# Patient Record
Sex: Female | Born: 1935 | Race: White | Hispanic: No | Marital: Married | Smoking: Never smoker
Health system: Southern US, Community
[De-identification: ages and names within clinical notes are randomized; demographics above are authoritative.]

## PROBLEM LIST (undated history)

## (undated) DIAGNOSIS — E785 Hyperlipidemia, unspecified: Secondary | ICD-10-CM

## (undated) DIAGNOSIS — F0390 Unspecified dementia without behavioral disturbance: Secondary | ICD-10-CM

## (undated) DIAGNOSIS — M8580 Other specified disorders of bone density and structure, unspecified site: Secondary | ICD-10-CM

---

## 1997-12-26 ENCOUNTER — Other Ambulatory Visit: Admission: RE | Admit: 1997-12-26 | Discharge: 1997-12-26 | Payer: Self-pay | Admitting: Obstetrics & Gynecology

## 1999-01-23 ENCOUNTER — Other Ambulatory Visit: Admission: RE | Admit: 1999-01-23 | Discharge: 1999-01-23 | Payer: Self-pay | Admitting: Obstetrics & Gynecology

## 1999-10-30 ENCOUNTER — Encounter: Payer: Self-pay | Admitting: Neurology

## 1999-10-30 ENCOUNTER — Ambulatory Visit (HOSPITAL_COMMUNITY): Admission: RE | Admit: 1999-10-30 | Discharge: 1999-10-30 | Payer: Self-pay | Admitting: Neurology

## 2000-02-04 ENCOUNTER — Other Ambulatory Visit: Admission: RE | Admit: 2000-02-04 | Discharge: 2000-02-04 | Payer: Self-pay | Admitting: Obstetrics & Gynecology

## 2001-03-18 ENCOUNTER — Encounter: Admission: RE | Admit: 2001-03-18 | Discharge: 2001-03-18 | Payer: Self-pay | Admitting: Orthopedic Surgery

## 2001-03-18 ENCOUNTER — Encounter: Payer: Self-pay | Admitting: Orthopedic Surgery

## 2001-03-20 ENCOUNTER — Ambulatory Visit (HOSPITAL_BASED_OUTPATIENT_CLINIC_OR_DEPARTMENT_OTHER): Admission: RE | Admit: 2001-03-20 | Discharge: 2001-03-20 | Payer: Self-pay | Admitting: Orthopedic Surgery

## 2001-08-18 ENCOUNTER — Encounter: Admission: RE | Admit: 2001-08-18 | Discharge: 2001-08-18 | Payer: Self-pay | Admitting: Pulmonary Disease

## 2001-08-18 ENCOUNTER — Emergency Department (HOSPITAL_COMMUNITY): Admission: EM | Admit: 2001-08-18 | Discharge: 2001-08-18 | Payer: Self-pay | Admitting: Emergency Medicine

## 2001-08-18 ENCOUNTER — Encounter: Payer: Self-pay | Admitting: Pulmonary Disease

## 2001-08-18 ENCOUNTER — Encounter: Payer: Self-pay | Admitting: Emergency Medicine

## 2002-06-16 ENCOUNTER — Other Ambulatory Visit: Admission: RE | Admit: 2002-06-16 | Discharge: 2002-06-16 | Payer: Self-pay | Admitting: Obstetrics & Gynecology

## 2002-08-25 ENCOUNTER — Emergency Department (HOSPITAL_COMMUNITY): Admission: EM | Admit: 2002-08-25 | Discharge: 2002-08-25 | Payer: Self-pay | Admitting: *Deleted

## 2002-08-25 ENCOUNTER — Encounter: Payer: Self-pay | Admitting: *Deleted

## 2004-06-04 ENCOUNTER — Ambulatory Visit: Payer: Self-pay | Admitting: Pulmonary Disease

## 2004-07-16 ENCOUNTER — Emergency Department (HOSPITAL_COMMUNITY): Admission: EM | Admit: 2004-07-16 | Discharge: 2004-07-16 | Payer: Self-pay | Admitting: Emergency Medicine

## 2004-07-17 ENCOUNTER — Ambulatory Visit: Payer: Self-pay | Admitting: Pulmonary Disease

## 2004-10-01 ENCOUNTER — Ambulatory Visit: Payer: Self-pay | Admitting: Pulmonary Disease

## 2004-10-31 ENCOUNTER — Other Ambulatory Visit: Admission: RE | Admit: 2004-10-31 | Discharge: 2004-10-31 | Payer: Self-pay | Admitting: Obstetrics & Gynecology

## 2005-02-08 ENCOUNTER — Ambulatory Visit: Payer: Self-pay | Admitting: Pulmonary Disease

## 2005-06-11 ENCOUNTER — Ambulatory Visit: Payer: Self-pay | Admitting: Pulmonary Disease

## 2005-12-10 ENCOUNTER — Ambulatory Visit: Payer: Self-pay | Admitting: Pulmonary Disease

## 2006-01-03 ENCOUNTER — Emergency Department (HOSPITAL_COMMUNITY): Admission: EM | Admit: 2006-01-03 | Discharge: 2006-01-03 | Payer: Self-pay | Admitting: Emergency Medicine

## 2006-05-02 ENCOUNTER — Ambulatory Visit: Payer: Self-pay | Admitting: Gastroenterology

## 2006-05-26 ENCOUNTER — Ambulatory Visit (HOSPITAL_COMMUNITY): Admission: RE | Admit: 2006-05-26 | Discharge: 2006-05-26 | Payer: Self-pay | Admitting: Gastroenterology

## 2006-05-26 ENCOUNTER — Ambulatory Visit: Payer: Self-pay | Admitting: Gastroenterology

## 2006-05-27 ENCOUNTER — Ambulatory Visit (HOSPITAL_COMMUNITY): Admission: RE | Admit: 2006-05-27 | Discharge: 2006-05-27 | Payer: Self-pay | Admitting: Gastroenterology

## 2006-07-03 ENCOUNTER — Ambulatory Visit: Payer: Self-pay | Admitting: Gastroenterology

## 2006-07-17 ENCOUNTER — Ambulatory Visit: Payer: Self-pay | Admitting: Gastroenterology

## 2006-07-28 ENCOUNTER — Ambulatory Visit (HOSPITAL_COMMUNITY): Admission: RE | Admit: 2006-07-28 | Discharge: 2006-07-28 | Payer: Self-pay | Admitting: Gastroenterology

## 2006-07-31 ENCOUNTER — Ambulatory Visit (HOSPITAL_COMMUNITY): Admission: RE | Admit: 2006-07-31 | Discharge: 2006-07-31 | Payer: Self-pay | Admitting: Gastroenterology

## 2006-07-31 ENCOUNTER — Encounter (INDEPENDENT_AMBULATORY_CARE_PROVIDER_SITE_OTHER): Payer: Self-pay | Admitting: Specialist

## 2006-07-31 ENCOUNTER — Ambulatory Visit: Payer: Self-pay | Admitting: Gastroenterology

## 2006-08-13 ENCOUNTER — Ambulatory Visit: Payer: Self-pay | Admitting: Gastroenterology

## 2006-12-18 ENCOUNTER — Ambulatory Visit (HOSPITAL_COMMUNITY): Admission: RE | Admit: 2006-12-18 | Discharge: 2006-12-18 | Payer: Self-pay | Admitting: Internal Medicine

## 2007-03-03 ENCOUNTER — Emergency Department (HOSPITAL_COMMUNITY): Admission: EM | Admit: 2007-03-03 | Discharge: 2007-03-04 | Payer: Self-pay | Admitting: Emergency Medicine

## 2007-03-11 ENCOUNTER — Ambulatory Visit (HOSPITAL_COMMUNITY): Admission: RE | Admit: 2007-03-11 | Discharge: 2007-03-11 | Payer: Self-pay | Admitting: Ophthalmology

## 2010-12-11 NOTE — Op Note (Signed)
NAMELARAYAH, CLUTE             ACCOUNT NO.:  [id number]   MEDICAL RECORD NO.:  [id number]          PATIENT TYPE:  AMB   LOCATION:  DAY                           FACILITY:  [facility]   PHYSICIAN:  Susanne Greenhouse, MD       DATE OF BIRTH:  [birth year]   DATE OF PROCEDURE:  03/11/2007  DATE OF DISCHARGE:  03/11/2007                               OPERATIVE REPORT   PREOPERATIVE DIAGNOSIS:  Subluxed posterior chamber intraocular lens,  right eye.   POSTOPERATIVE DIAGNOSIS:  Subluxed posterior chamber intraocular lens,  right eye.   OPERATION PERFORMED:  Relocation of poster chamber interocular lens with  iris fixation, right eye.   SURGEON:  Susanne Greenhouse, M.D.   ANESTHESIA:  Was topical with monitored anesthesia care.   OPERATIVE SUMMARY:  In the preoperative holding area, dilating drops and  2% viscous lidocaine were placed into the right eye.  The patient was  then brought to the operating room on the eye was prepped and draped.  A  super-sharp blade was used to make two paracenteses, one at the  surgeon's 4 o'clock position and the 7 o'clock position.  A 2.85-mm  keratome incision was made at the temporal limbus.  The anterior chamber  was filled with Viscoat to push the vitreous back and to protect the  cornea.  Using a Sinskey hook and Kuglen hook, the intraocular lens was  engaged and pulled into the center of the visual axis in the ciliary  sulcus.  Once the lens was in the sulcus, it was prolapsed forward, and  Miochol was used to constrict the pupil, creating an iris capture of the  lens optic with the haptics still behind the iris.  Using a 10-0  polypropylene suture on a long needle, a suture was placed through the  iris underneath the haptic and through the iris again and then out  through the cornea.  The Siepser knot was tied to fixate the IOL to the  iris.  The ends were then cut.  The IOL was not as central as ideal, so  a second iris fixation suture was placed on the  other lens haptic.  After both haptics were fixated, the optic was then prolapsed back into  the posterior chamber through the pupil.  Good centration was achieved  with a second suture.  With irrigation and aspiration, Viscoat was then  removed from the anterior chamber.  Some Viscoat was left in the eye in  the posterior chamber and vitreous chamber.  The incisions were hydrated  with BSS and checked for leak which were negative.  The patient  tolerated the procedure well.  There were no operative complications,  and she was returned to the recovery area in satisfactory condition.           ______________________________  Susanne Greenhouse, MD     KEH/MEDQ  D:  04/10/2007  T:  04/11/2007  Job:  [id number]

## 2010-12-14 NOTE — Consult Note (Signed)
NAMEMICHELLE, WNEK             ACCOUNT NO.:  [id number]   MEDICAL RECORD NO.:  [id number]          PATIENT TYPE:  AMB   LOCATION:                                FACILITY:  [facility]   PHYSICIAN:  Kassie Mends, M.D.      DATE OF BIRTH:  August 28, 1922   DATE OF CONSULTATION:  05/02/2006  DATE OF DISCHARGE:                                   CONSULTATION   REASON FOR CONSULTATION:  Abdominal pain and bloating.   HISTORY OF PRESENT ILLNESS:  Gloria Mathis is an 77 year old female who has  been complaining of her stomach swelling and being puffed up and hard  and  not comfortable for 2 months.  Over the last 2 weeks, it has been more  noticeable.  She has had pain in there on occasion.  It is out of the  ordinary.  She denies any weight loss.  She usually has one bowel movement a  day.  This past week, it has been less than 1 day.  She denies any blood in  her stool or black tarry stools.  She has had no nausea, vomiting, or  difficulty swallowing.  Her appetite has been poor at times.  Her energy  level is okay.  She had been passing gas.  She has never had any abnormal  Paps or cyst on her ovaries.  Her mammogram is up-to-date.  She is not as  regular in the last week.  She burps some to a little.  She has never had  any workup.  She does not consume much milk.  She has had no dietary  changes.  If she does consume a milk, it is lactate.  She denies any cheese  or ice cream.  She drinks less than one carbonated beverage a day.  She  denies any sugar-free gum or candy consumption.  Her last bowel movement was  this morning which was hard and she did not have to strain.  He saw Dr.  Wende Crease who told her she needed some Milk of Magnesia.  She denies any chest  pain, shortness of breath, cough, back pain, or hemoptysis.  She has never  had a colonoscopy.   PAST MEDICAL HISTORY:  1. Hyperlipidemia treated with diet.  2. Osteopenia.   PAST SURGICAL HISTORY:  Appendectomy in the 1940s.   ALLERGIES:  PENICILLIN.   MEDICATIONS:  1. Multivitamin daily.  2. Caltrate D 600 mg two twice daily for the last 4-5 months.   FAMILY HISTORY:  She has a family history of her parents dying in a car  accident.  She denies any family history of colon cancer or colon polyps.   SOCIAL HISTORY:  She is married and has three children.  She is a retired  [employer] operational officer.  She denies any tobacco or alcohol use.   REVIEW OF SYSTEMS:  As per the HPI, otherwise all systems are negative.   PHYSICAL EXAMINATION:  VITAL SIGNS:  Weight 102.5 pounds.  Height 5 feet 6  inches.  BMI 16.5 (underweight).  Temperature 99.2. Blood pressure 124/68.  Pulse 72. GENERAL:  In  general, she is in no apparent distress, alert and  oriented x4.  HEENT EXAM:  Atraumatic.  Normocephalic.  Pupils equal, react  to light.  Mouth:  No oral lesions.  Posterior pharynx without erythema or  exudate.  NECK:  Her neck has full range of motion and no lymphadenopathy.  LUNGS:  Her lungs are clear to auscultation bilaterally.  CARDIOVASCULAR:  Exam is a regular rhythm; no murmur; normal S1 and S2.  ABDOMEN:  Bowel sounds are present; nontender; mild distention; tympanic to  percussion; no hepatosplenomegaly; no rebound or guarding.  EXTREMITIES:  Her extremities are without cyanosis, clubbing, or edema.  NEURO: She has no focal neurologic deficits.   She did bring plain films, KUB, flat and upright today which I personally  reviewed with her and her husband.  She has a nonspecific bowel gas pattern.  She has a significant stool burden, but no dilated loops of bowel, and no  air fluid levels.   ASSESSMENT:  Gloria Mathis is an 77 year old female female with mild abdominal  discomfort and bloating likely secondary to constipation.  She may be  experiencing constipation from her Caltrate and vitamin D.  Because she is  an 77 year old female who is underweight, she likely needs her calcium  supplementation.  The differential diagnosis  includes thyroid abnormalities  and electrolyte abnormalities and a low likelihood of occult malignancy.  Thank you for allowing me to see Gloria Mathis in consultation.  My  recommendations follow.   RECOMMENDATIONS:  1. I will check a CBC, potassium, thyroid, and calcium today.  2. For her constipation, she is asked to take MiraLax in the morning and      Fibersure at noon.  I would like to keep the stools soft and not runny      or watery.  3. She needs a screening colonoscopy because she is a highly functioning      77 year old female.  She will be given the OsmoPrep and the test will      be scheduled within the next 2-3 weeks.  If her symptoms do not      significantly improve  within the next 7-10 days, then I will consider      a CT scan of the abdomen and pelvis or discontinuing the Caltrate and      see how she does.  She may need an alternative to managing her      osteopenia.  4. She has a follow up appointment to see me in 3 weeks.   Please feel free to contact me at [phone] with additional questions.      Kassie Mends, M.D.  Electronically Signed     SM/MEDQ  D:  05/02/2006  T:  05/04/2006  Job:  [id number]   cc:   Laverle Hobby, M.D.  [address]  [place], [place] [address]

## 2010-12-14 NOTE — Op Note (Signed)
[facility]  Patient:    Gloria Mathis, Gloria Mathis Visit Number: [id number] MRN: [id number]          Service Type: DSU Location: [facility] Attending Physician:  Susa Day Proc. Date: 03/20/01 Adm. Date:  03/20/2001 Disc. Date: 03/20/2001                             Operative Report  PREOPERATIVE DIAGNOSIS:  Chronic stenosing tenosynovitis, left first dorsal compartment.  POSTOPERATIVE DIAGNOSIS:  Chronic stenosing tenosynovitis, left first dorsal compartment.  PROCEDURE:  Release of left first dorsal compartment.  SURGEON:  Katy Fitch. Sypher, Montez Hageman., M.D.  ASSISTANT:  Jonni Sanger, P.A.  ANESTHESIA:  0.25% Marcaine and 2% lidocaine field block of first dorsal compartment and radial sensory branches, left wrist, supervised by the anesthesiologist, Burna Forts, M.D.  INDICATIONS:  Gloria Mathis is a 77 year old woman who has had chronic stenosing tenosynovitis of the left dorsal compartment for months.  This has not responded to splinting, activity modification, and anti-inflammatory medication.  Due to a failure to respond to nonoperative measures, she is brought to the operating room at this time for release of her left first dorsal compartment.  DESCRIPTION OF PROCEDURE:  Gloria Mathis was brought to the operating room and placed in the supine position on the operating room table.  Following light sedation, the left arm was prepped with Betadine solution and sterilely draped.  A pneumatic tourniquet was applied to the proximal brachium.  Following exsanguination of the limb with an Esmarch bandage, the tourniquet was placed to 220 mmHg.  Marcaine 0.25% and 2% lidocaine were infiltrated at the radial styloid and in the path of the radial sensory branches for a field block anesthesia.  When anesthesia was satisfactory, the procedure commenced with a short transverse incision directly over the palpably and visibly  enlarged first dorsal compartment.  Subcutaneous tissues were carefully divided, revealing the radial sensory branches, which were gently retracted.  The compartment was split longitudinally, revealing three slips of tendon. Two represented abductor pollicis longus tendon slips, and the third the extensor pollicis brevis.  There was not a discrete compartment for the extensor pollicis brevis.  The compartment had thickened to approximately 3-4 mm wall diameter.  The inflammatory tenosynovium was cleared with the rongeur.  Thereafter, full range of motion of the thumb and the wrist was recovered.  The wound was repaired with intradermal 3-0 Prolene and Steri-Strips.  A pressure dressing was applied with an Ace wrap.  There were no apparent complications. Attending Physician:  Susa Day DD:  03/20/01 TD:  03/23/01 Job: [id number]

## 2010-12-14 NOTE — Op Note (Signed)
NAMEPERNELLA, Gloria Mathis             ACCOUNT NO.:  [id number]   MEDICAL RECORD NO.:  [id number]          PATIENT TYPE:  AMB   LOCATION:  DAY                           FACILITY:  [facility]   PHYSICIAN:  Kassie Mends, M.D.      DATE OF BIRTH:  1923-07-28   DATE OF PROCEDURE:  07/31/2006  DATE OF DISCHARGE:                               OPERATIVE REPORT   REFERRING PHYSICIAN:  Laverle Hobby, M.D.   PROCEDURE:  Sigmoidoscopy with cold forceps biopsy.   INDICATION FOR EXAM:  Gloria Mathis is an 77 year old female who complains  of persistent bloating which occurs nocturnally.  It is associated with  weight loss.  She was previously seen and had a failed attempt at  colonoscopy.  Barium enema showed no evidence of stricture or masses.  She had a CT scan in December 2007, which showed an 8-9 cm segment of  sigmoid colon with a thickened wall to suggest possible malignancy.  Sigmoidoscopy is performed for this reason.   FINDINGS:  1. Sigmoidoscopy is performed with a diagnostic gastroscope.  The      diagnostic gastroscope was passed to the splenic flexure.  She had      multiple wide-mouth diverticula seen.  She had multiple thickened      sigmoid folds.  No masses or polyps were seen.  She did have was      hyperemic folds which were seen.  Biopsies were obtained via cold      forceps.  Otherwise no AVMs or inflammatory changes seen.  No      ulcerations.  2. Small internal hemorrhoids.  Otherwise normal retroflexed view of      the rectum.   RECOMMENDATIONS:  1. The most likely explanation for the thickened sigmoid segment is      diverticulosis.  She had no evidence of masses on endoscopic      evaluation.  Her bloating is likely secondary to colonic      dysmotility.  She had a large stool burden seen on CT scan.  She      will be asked to use MiraLax 17 g twice daily.  2. I will call her with biopsy report.  3. She should follow up with me in 1 month.   MEDICATIONS:  1. Demerol 75 mg  IV.  2. Versed 3 mg IV.   PROCEDURE TECHNIQUE:  Physical exam was performed and informed consent  was obtained from the patient after explaining the benefits, risks and  alternatives to the procedure.  The patient was connected to the monitor  and placed in the left lateral position.  Continuous oxygen was provided  by nasal cannula and IV medicine was administered through an indwelling  cannula.  After administration of sedation and rectal exam, the  patient's rectum was intubated and the scope was passed under direct  visualization to the splenic flexure.  The scope was subsequently  removed slowly by carefully avoiding the formed stool in the lumen and  examining the color, texture,  anatomy and integrity of the mucosa on the way out.  Polyps less than 1  cm would have been easily missed due to formed stool in the lumen.  The  patient was recovered in the endoscopy suite and discharged home in  satisfactory condition.      Kassie Mends, M.D.  Electronically Signed     SM/MEDQ  D:  07/31/2006  T:  07/31/2006  Job:  [id number]   cc:   Laverle Hobby, M.D.  [address]  [place], [place] [address]

## 2010-12-14 NOTE — Op Note (Signed)
NAMEEVIA, GOLDSMITH             ACCOUNT NO.:  [id number]   MEDICAL RECORD NO.:  [id number]          PATIENT TYPE:  AMB   LOCATION:  DAY                           FACILITY:  [facility]   PHYSICIAN:  Kassie Mends, M.D.      DATE OF BIRTH:  October 02, 1922   DATE OF PROCEDURE:  05/26/2006  DATE OF DISCHARGE:  05/26/2006                                 OPERATIVE REPORT   REFERRING PHYSICIAN:  Dr. Wende Crease.   PROCEDURE:  Sigmoidoscopy.   INDICATION FOR EXAMINATION:  Ms. Gloria Mathis is an 77 year old female who  presents for average-risk screening.  She presented as an outpatient with  increase in her abdominal bloating.   FINDINGS:  1. Extremely tortuous sigmoid colon which prevented passing of the      colonoscope beyond 30-40 cm from the anal verge.  2. Sigmoid colon diverticulosis.  3. Normal retroflexed view of the rectum.   RECOMMENDATIONS:  1. Double contrast barium enema on October30 to complete exam.  2. Clear liquid diet until double-contrast barium enema complete.  3. Consider changing to Os-Cal one 3 times a day to prevent bloating.  May      continue to use MiraLax as needed for abdominal bloating.  4. Follow up with Dr. Cira Servant in 1 month.   MEDICATIONS:  1. Demerol 1 mg IV.  2. Versed 6 mg IV.   PROCEDURE TECHNIQUE:  Physical exam was performed.  Informed consent was  obtained from the patient after explaining the benefits, risks and  alternatives to the procedure.  The patient was connected to the monitor and  placed in the left lateral position.  Continuous oxygen was provided by  nasal cannula and IV medicine administered through an indwelling cannula.  After administration of sedation and rectal exam, the patient's rectum was  intubated,  and the scope was advanced under direct visualization to the sigmoid colon.  The scope was subsequently moved slowly by carefully examining the color,  texture, anatomy and integrity of the mucosa on the way out.  The patient  was recovered  in the endoscopy suite and discharged to home in satisfactory  condition.      Kassie Mends, M.D.  Electronically Signed     SM/MEDQ  D:  05/27/2006  T:  05/28/2006  Job:  [id number]   cc:   Laverle Hobby, M.D.  [address]  [place], [place] [address]

## 2011-04-23 ENCOUNTER — Encounter: Payer: Self-pay | Admitting: Gastroenterology

## 2011-05-13 LAB — BASIC METABOLIC PANEL
CO2: 30
CO2: 30
Calcium: 9.5
Chloride: 105
GFR calc Af Amer: >60
GFR calc non Af Amer: >60
Glucose, Bld: 107 — ABNORMAL HIGH
Potassium: 3.6
Sodium: 142

## 2011-05-13 LAB — CBC
Hemoglobin: 12.8
MCV: 93.2
Platelets: 212
WBC: 8.4

## 2011-05-13 LAB — HEMOGLOBIN AND HEMATOCRIT, BLOOD
HCT: 39.4
Hemoglobin: 13.4

## 2011-05-13 LAB — DIFFERENTIAL
Basophils Absolute: 0
Basophils Relative: 0
Lymphocytes Relative: 15
Monocytes Relative: 9
Neutro Abs: 6.1

## 2011-05-13 LAB — SEDIMENTATION RATE: Sed Rate: 27 — ABNORMAL HIGH

## 2014-09-05 ENCOUNTER — Ambulatory Visit (HOSPITAL_COMMUNITY)
Admission: RE | Admit: 2014-09-05 | Discharge: 2014-09-05 | Disposition: A | Discharge status: Home / Self Care | Payer: PPO | Source: Ambulatory Visit | Attending: Family Medicine | Admitting: Family Medicine

## 2014-09-05 ENCOUNTER — Other Ambulatory Visit (HOSPITAL_COMMUNITY): Payer: Self-pay | Admitting: Family Medicine

## 2014-09-05 DIAGNOSIS — M25512 Pain in left shoulder: Secondary | ICD-10-CM | POA: Insufficient documentation

## 2014-09-05 DIAGNOSIS — M19012 Primary osteoarthritis, left shoulder: Secondary | ICD-10-CM

## 2014-09-05 DIAGNOSIS — M8580 Other specified disorders of bone density and structure, unspecified site: Secondary | ICD-10-CM | POA: Insufficient documentation

## 2014-09-05 DIAGNOSIS — M25511 Pain in right shoulder: Secondary | ICD-10-CM | POA: Diagnosis not present

## 2014-09-05 DIAGNOSIS — M778 Other enthesopathies, not elsewhere classified: Secondary | ICD-10-CM | POA: Insufficient documentation

## 2014-09-05 DIAGNOSIS — M19011 Primary osteoarthritis, right shoulder: Secondary | ICD-10-CM

## 2014-11-29 ENCOUNTER — Telehealth: Payer: Self-pay | Admitting: *Deleted

## 2014-11-29 ENCOUNTER — Emergency Department (HOSPITAL_COMMUNITY): Payer: PPO

## 2014-11-29 ENCOUNTER — Emergency Department (HOSPITAL_COMMUNITY)
Admission: EM | Admit: 2014-11-29 | Discharge: 2014-11-29 | Disposition: A | Discharge status: Home / Self Care | Payer: PPO | Source: Home / Self Care | Attending: Emergency Medicine | Admitting: Emergency Medicine

## 2014-11-29 ENCOUNTER — Encounter (HOSPITAL_COMMUNITY): Payer: Self-pay | Admitting: *Deleted

## 2014-11-29 DIAGNOSIS — N390 Urinary tract infection, site not specified: Secondary | ICD-10-CM

## 2014-11-29 DIAGNOSIS — R531 Weakness: Secondary | ICD-10-CM | POA: Insufficient documentation

## 2014-11-29 DIAGNOSIS — R011 Cardiac murmur, unspecified: Secondary | ICD-10-CM | POA: Insufficient documentation

## 2014-11-29 DIAGNOSIS — B9620 Unspecified Escherichia coli [E. coli] as the cause of diseases classified elsewhere: Secondary | ICD-10-CM | POA: Diagnosis present

## 2014-11-29 DIAGNOSIS — D649 Anemia, unspecified: Secondary | ICD-10-CM | POA: Insufficient documentation

## 2014-11-29 DIAGNOSIS — R7881 Bacteremia: Secondary | ICD-10-CM | POA: Diagnosis not present

## 2014-11-29 DIAGNOSIS — F0390 Unspecified dementia without behavioral disturbance: Secondary | ICD-10-CM | POA: Diagnosis present

## 2014-11-29 DIAGNOSIS — E876 Hypokalemia: Secondary | ICD-10-CM | POA: Diagnosis present

## 2014-11-29 DIAGNOSIS — E785 Hyperlipidemia, unspecified: Secondary | ICD-10-CM | POA: Diagnosis present

## 2014-11-29 DIAGNOSIS — E860 Dehydration: Secondary | ICD-10-CM

## 2014-11-29 HISTORY — DX: Unspecified dementia, unspecified severity, without behavioral disturbance, psychotic disturbance, mood disturbance, and anxiety: F03.90

## 2014-11-29 LAB — URINALYSIS, ROUTINE W REFLEX MICROSCOPIC
BILIRUBIN URINE: NEGATIVE
Glucose, UA: NEGATIVE mg/dL
KETONES UR: NEGATIVE mg/dL
NITRITE: NEGATIVE
PH: 8 (ref 5.0–8.0)
Protein, ur: 100 mg/dL — AB
Specific Gravity, Urine: 1.02 (ref 1.005–1.030)
UROBILINOGEN UA: 2 mg/dL — AB (ref 0.0–1.0)

## 2014-11-29 LAB — URINE MICROSCOPIC-ADD ON

## 2014-11-29 LAB — CBC WITH DIFFERENTIAL/PLATELET
BASOS PCT: 0 % (ref 0–1)
Basophils Absolute: 0 10*3/uL (ref 0.0–0.1)
Eosinophils Absolute: 0 10*3/uL (ref 0.0–0.7)
Eosinophils Relative: 0 % (ref 0–5)
HEMATOCRIT: 29.9 % — AB (ref 36.0–46.0)
Hemoglobin: 9.9 g/dL — ABNORMAL LOW (ref 12.0–15.0)
LYMPHS PCT: 2 % — AB (ref 12–46)
Lymphs Abs: 0.4 10*3/uL — ABNORMAL LOW (ref 0.7–4.0)
MCH: 29.6 pg (ref 26.0–34.0)
MCHC: 33.1 g/dL (ref 30.0–36.0)
MCV: 89.5 fL (ref 78.0–100.0)
MONOS PCT: 8 % (ref 3–12)
Monocytes Absolute: 1.5 10*3/uL — ABNORMAL HIGH (ref 0.1–1.0)
NEUTROS ABS: 17.4 10*3/uL — AB (ref 1.7–7.7)
NEUTROS PCT: 90 % — AB (ref 43–77)
Platelets: 186 10*3/uL (ref 150–400)
RBC: 3.34 MIL/uL — AB (ref 3.87–5.11)
RDW: 14.5 % (ref 11.5–15.5)
WBC: 19.3 10*3/uL — AB (ref 4.0–10.5)

## 2014-11-29 LAB — POC OCCULT BLOOD, ED: Fecal Occult Bld: NEGATIVE

## 2014-11-29 LAB — COMPREHENSIVE METABOLIC PANEL
ALBUMIN: 2.7 g/dL — AB (ref 3.5–5.0)
ALT: 15 U/L (ref 14–54)
AST: 26 U/L (ref 15–41)
Alkaline Phosphatase: 121 U/L (ref 38–126)
Anion gap: 9 (ref 5–15)
BILIRUBIN TOTAL: 0.8 mg/dL (ref 0.3–1.2)
BUN: 29 mg/dL — ABNORMAL HIGH (ref 6–20)
CALCIUM: 8.4 mg/dL — AB (ref 8.9–10.3)
CHLORIDE: 102 mmol/L (ref 101–111)
CO2: 25 mmol/L (ref 22–32)
CREATININE: 0.77 mg/dL (ref 0.44–1.00)
GFR calc Af Amer: >60 mL/min (ref >60)
GFR calc non Af Amer: >60 mL/min (ref >60)
Glucose, Bld: 138 mg/dL — ABNORMAL HIGH (ref 70–99)
Potassium: 2.8 mmol/L — ABNORMAL LOW (ref 3.5–5.1)
SODIUM: 136 mmol/L (ref 135–145)
Total Protein: 6.6 g/dL (ref 6.5–8.1)

## 2014-11-29 LAB — I-STAT CG4 LACTIC ACID, ED: LACTIC ACID, VENOUS: 0.84 mmol/L (ref 0.5–2.0)

## 2014-11-29 MED ORDER — SODIUM CHLORIDE 0.9 % IV BOLUS (SEPSIS)
1000.0000 mL | Freq: Once | INTRAVENOUS | Status: AC
  Administered 2014-11-29: 1000 mL via INTRAVENOUS

## 2014-11-29 MED ORDER — DEXTROSE 5 % IV SOLN
1.0000 g | Freq: Once | INTRAVENOUS | Status: AC
  Administered 2014-11-29: 1 g via INTRAVENOUS
  Filled 2014-11-29: qty 10

## 2014-11-29 MED ORDER — POTASSIUM CHLORIDE CRYS ER 20 MEQ PO TBCR: 20.0000 meq | EXTENDED_RELEASE_TABLET | Freq: Two times a day (BID) | ORAL | Status: DC

## 2014-11-29 MED ORDER — POTASSIUM CHLORIDE CRYS ER 20 MEQ PO TBCR
40.0000 meq | EXTENDED_RELEASE_TABLET | Freq: Once | ORAL | Status: AC
  Administered 2014-11-29: 40 meq via ORAL
  Filled 2014-11-29: qty 2

## 2014-11-29 MED ORDER — SODIUM CHLORIDE 0.9 % IV BOLUS (SEPSIS)
500.0000 mL | Freq: Once | INTRAVENOUS | Status: AC
  Administered 2014-11-29: 500 mL via INTRAVENOUS

## 2014-11-29 MED ORDER — CEPHALEXIN 500 MG PO CAPS: 500.0000 mg | ORAL_CAPSULE | Freq: Three times a day (TID) | ORAL | Status: DC

## 2014-11-29 NOTE — ED Notes (Signed)
Spoke with Dr. Juleen ChinaKohut at [facility] who recommends return to ED for additional treatment of (+) blood cultures.  Contacted spouse of patient who states that he will bring patient back to ED in early AM of 05/02016

## 2014-11-29 NOTE — ED Notes (Signed)
(+)   anaerobic bottle X 1 with Gram (-)rods called to [facility] by [facility]

## 2014-11-29 NOTE — ED Provider Notes (Signed)
CSN: [id number]     Arrival date & time 11/29/14  0022 History  This chart was scribed for Devoria Albe, MD by Phillis Haggis, ED Scribe. This patient was seen in room APA04/APA04 and patient care was started at 12:36 AM.    Chief Complaint  Patient presents with  . Fever   Level 5 Caveat for dementia  Patient is a 81 y.o. female presenting with fever. The history is provided by the patient. The history is limited by the condition of the patient. No language interpreter was used.  Fever   HPI Comments: Gloria Mathis is a 81 y.o. Female with a history of dementia who presents to the Emergency Department brought in by EMS complaining of fever and weakness onset 3 days ago. Patient states that she does not know why she is here. She states "Am I supposed to be sick?".  Per EMS, patient was given tylenol., but nurse states they didn't tell him what her fever was when they checked it.  Patient denies cough, runny nose, nausea, vomiting, diarrhea, appetite change. Patient was brought from her home and husband is on the way.   PCP Dr Janna Arch  Past Medical History  Diagnosis Date  . Dementia    No past surgical history on file. No family history on file. History  Substance Use Topics  . Smoking status: Not on file  . Smokeless tobacco: Not on file  . Alcohol Use: Not on file   Lives at home Lives with spouse  OB History    No data available     Review of Systems  Unable to perform ROS: Dementia  Constitutional: Positive for fever.  Neurological: Positive for weakness.   Allergies  Review of patient's allergies indicates no known allergies.  Home Medications   Prior to Admission medications   Medication Sig Start Date End Date Taking? Authorizing Provider  cephALEXin (KEFLEX) 500 MG capsule Take 1 capsule (500 mg total) by mouth 3 (three) times daily. 11/29/14   Devoria Albe, MD  potassium chloride SA (K-DUR,KLOR-CON) 20 MEQ tablet Take 1 tablet (20 mEq total) by mouth 2 (two) times  daily. 11/29/14   Devoria Albe, MD   BP 131/57 mmHg  Pulse 108  Temp(Src) 99.4 F (37.4 C) (Oral)  Resp 20  Ht  (1.727 m)  Wt 94 lb 5 oz (42.78 kg)  BMI 14.34 kg/m2  SpO2 94%  Rectal temp 100.3 (had already been given tylenol by EMS)   Vital signs normal except tachycardia, low grade temp   Physical Exam  Constitutional:  Non-toxic appearance. She does not appear ill. No distress.  Thin, frail elderly female; smiling and in no distress; cooperative  HENT:  Head: Normocephalic and atraumatic.  Right Ear: External ear normal.  Left Ear: External ear normal.  Nose: Nose normal. No mucosal edema or rhinorrhea.  Mouth/Throat: Oropharynx is clear and moist and mucous membranes are normal. No dental abscesses or uvula swelling.  Eyes: Conjunctivae and EOM are normal. Pupils are equal, round, and reactive to light.  Neck: Normal range of motion and full passive range of motion without pain. Neck supple.  Cardiovascular: Normal rate and regular rhythm.  Exam reveals no gallop and no friction rub.   Murmur heard. Irregular heartbeat  Pulmonary/Chest: Effort normal and breath sounds normal. No respiratory distress. She has no wheezes. She has no rhonchi. She has no rales. She exhibits no tenderness and no crepitus.  Abdominal: Soft. Normal appearance and bowel sounds are normal.  She exhibits no distension. There is no tenderness. There is no rebound and no guarding.  Musculoskeletal: Normal range of motion. She exhibits no edema or tenderness.  Moves all extremities well.   Neurological: She is alert. She has normal strength. No cranial nerve deficit.  cooperative  Skin: Skin is warm, dry and intact. No rash noted. No erythema. No pallor.  She feels hot to touch  Psychiatric: She has a normal mood and affect. Her speech is normal and behavior is normal. Her mood appears not anxious.  Nursing note and vitals reviewed.   ED Course  Procedures (including critical care  time)  , Medications  sodium chloride 0.9 % bolus 1,000 mL (0 mLs Intravenous Stopped 11/29/14 0141)  potassium chloride SA (K-DUR,KLOR-CON) CR tablet 40 mEq (40 mEq Oral Given 11/29/14 0208)  sodium chloride 0.9 % bolus 500 mL (0 mLs Intravenous Stopped 11/29/14 0246)  cefTRIAXone (ROCEPHIN) 1 g in dextrose 5 % 50 mL IVPB (0 g Intravenous Stopped 11/29/14 0321)  sodium chloride 0.9 % bolus 500 mL (0 mLs Intravenous Stopped 11/29/14 0422)    DIAGNOSTIC STUDIES: Oxygen Saturation is 94% on room air, adequate by my interpretation.    COORDINATION OF CARE: 12:42 AM-Discussed treatment plan which includes labs with pt at bedside and pt agreed to plan.   12:48 AM- husband now in ED and reports that he noticed symptoms in patient 3 days ago; states that she was unable to walk, trembling and weak but was given baby aspirin to relief. He reports that her symptoms came back but was unable to walk today and has not eaten anything in 3 days. He states that she was sliding off the bed tonight, but she didn't fall or hurt anything. He states that he checked her temperature and it was tmax 100.7 F. Discussed labs, catheter and rectal temp with husband at bedside and husband agreed to plan.   On review of patient's laboratory results she is noted to have a leukocytosis and a urinary tract infection. She was given Rocephin 1 g IV. Patient was also given IV fluids for dehydration. She has a very elevated BUN which is consistent with that. Patient was given oral potassium for her hypokalemia. Patient noted to have a new anemia that was not present in 2008. Stool Hemoccult however was negative. It is unclear of the chronicity of this anemia but she can be followed up by her PCP.  02:30 has been given her test results which show she has a urinary tract infection. He states he feels like she can go home where she would be more comfortable. We talked about reasons to return to the ED to consider admission.  Patient received  a total of 2 L of normal saline and was able to ambulate to the bathroom with steady gait. It was felt she could be discharged home to her husband's care.  Labs Review Results for orders placed or performed during the hospital encounter of 11/29/14  Blood Culture (routine x 2)  Result Value Ref Range   Specimen Description BLOOD LEFT ANTECUBITAL    Special Requests      BOTTLES DRAWN AEROBIC AND ANAEROBIC AEB 10CC ANA 9CC   Culture PENDING    Report Status PENDING   Blood Culture (routine x 2)  Result Value Ref Range   Specimen Description BLOOD RIGHT HAND    Special Requests BOTTLES DRAWN AEROBIC AND ANAEROBIC 8CC EACH    Culture PENDING    Report Status PENDING  Urinalysis, Routine w reflex microscopic  Result Value Ref Range   Color, Urine YELLOW YELLOW   APPearance TURBID (A) CLEAR   Specific Gravity, Urine 1.020 1.005 - 1.030   pH 8.0 5.0 - 8.0   Glucose, UA NEGATIVE NEGATIVE mg/dL   Hgb urine dipstick LARGE (A) NEGATIVE   Bilirubin Urine NEGATIVE NEGATIVE   Ketones, ur NEGATIVE NEGATIVE mg/dL   Protein, ur 161 (A) NEGATIVE mg/dL   Urobilinogen, UA 2.0 (H) 0.0 - 1.0 mg/dL   Nitrite NEGATIVE NEGATIVE   Leukocytes, UA LARGE (A) NEGATIVE  CBC WITH DIFFERENTIAL  Result Value Ref Range   WBC 19.3 (H) 4.0 - 10.5 K/uL   RBC 3.34 (L) 3.87 - 5.11 MIL/uL   Hemoglobin 9.9 (L) 12.0 - 15.0 g/dL   HCT 09.6 (L) 04.5 - 40.9 %   MCV 89.5 78.0 - 100.0 fL   MCH 29.6 26.0 - 34.0 pg   MCHC 33.1 30.0 - 36.0 g/dL   RDW 81.1 91.4 - 78.2 %   Platelets 186 150 - 400 K/uL   Neutrophils Relative % 90 (H) 43 - 77 %   Neutro Abs 17.4 (H) 1.7 - 7.7 K/uL   Lymphocytes Relative 2 (L) 12 - 46 %   Lymphs Abs 0.4 (L) 0.7 - 4.0 K/uL   Monocytes Relative 8 3 - 12 %   Monocytes Absolute 1.5 (H) 0.1 - 1.0 K/uL   Eosinophils Relative 0 0 - 5 %   Eosinophils Absolute 0.0 0.0 - 0.7 K/uL   Basophils Relative 0 0 - 1 %   Basophils Absolute 0.0 0.0 - 0.1 K/uL  Comprehensive metabolic panel  Result Value  Ref Range   Sodium 136 135 - 145 mmol/L   Potassium 2.8 (L) 3.5 - 5.1 mmol/L   Chloride 102 101 - 111 mmol/L   CO2 25 22 - 32 mmol/L   Glucose, Bld 138 (H) 70 - 99 mg/dL   BUN 29 (H) 6 - 20 mg/dL   Creatinine, Ser 9.56 0.44 - 1.00 mg/dL   Calcium 8.4 (L) 8.9 - 10.3 mg/dL   Total Protein 6.6 6.5 - 8.1 g/dL   Albumin 2.7 (L) 3.5 - 5.0 g/dL   AST 26 15 - 41 U/L   ALT 15 14 - 54 U/L   Alkaline Phosphatase 121 38 - 126 U/L   Total Bilirubin 0.8 0.3 - 1.2 mg/dL   GFR calc non Af Amer >60 >60 mL/min   GFR calc Af Amer >60 >60 mL/min   Anion gap 9 5 - 15  Urine microscopic-add on  Result Value Ref Range   WBC, UA TOO NUMEROUS TO COUNT <3 WBC/hpf   Bacteria, UA MANY (A) RARE  I-Stat CG4 Lactic Acid, ED  (not at [facility])  Result Value Ref Range   Lactic Acid, Venous 0.84 0.5 - 2.0 mmol/L  POC occult blood, ED RN will collect  Result Value Ref Range   Fecal Occult Bld NEGATIVE NEGATIVE    Laboratory interpretation all normal except new anemia however my last hemoglobin was from 2008, UTI, elevated BUN consistent with either GI bleeding or dehydration, Hemoccult was negative, hypokalemia    Imaging Review Dg Chest Port 1 View  11/29/2014   CLINICAL DATA:  Weakness fatigue and fever for 3 days  EXAM: PORTABLE CHEST - 1 VIEW  COMPARISON:  07/16/2004  FINDINGS: There is hyperinflation and moderate cardiomegaly. There is generalized interstitial thickening which may represent a degree of interstitial fluid superimposed on chronic fibrotic changes. No  confluent airspace consolidation is evident. No large effusion is evident.  IMPRESSION: COPD, possibly with a component of superimposed interstitial edema from CHF.   Electronically Signed   By: Ellery Plunk M.D.   On: 11/29/2014 01:14      EKG Interpretation   Date/Time:  Tuesday Nov 29 2014 01:10:30 EDT Ventricular Rate:  112 PR Interval:  155 QRS Duration: 72 QT Interval:  361 QTC Calculation: 493 R Axis:   35 Text Interpretation:   Sinus tachycardia Sinus arrhythmia Ventricular  trigeminy Probable left atrial enlargement Anteroseptal infarct, age  indeterminate Minimal ST depression, inferior leads Premature atrial  complexes No significant change since last tracing 10 Mar 2007 Confirmed  by [facility]  MD-I, Kayceon Oki ([id number]) on 11/29/2014 1:36:47 AM      MDM  patient presents with 3 days of getting an loss of appetite. Patient noted to have a UTI and was started on antibiotics. Urine culture was sent. Patient also noted to be anemic however her stool was negative for blood. Unfortunately the last blood work I have to compare to is in 2008 and this may be a chronic anemia that her PCP is aware of. She was started on antibiotics and her husband is to follow-up with her PCP about her anemia. She also was noted to have a hypokalemia and was discharged with potassium supplementation for that.    Final diagnoses:  Hypokalemia  Dehydration  Anemia, unspecified anemia type  Weakness  Urinary tract infection without hematuria, site unspecified   New Prescriptions   CEPHALEXIN (KEFLEX) 500 MG CAPSULE    Take 1 capsule (500 mg total) by mouth 3 (three) times daily.   POTASSIUM CHLORIDE SA (K-DUR,KLOR-CON) 20 MEQ TABLET    Take 1 tablet (20 mEq total) by mouth 2 (two) times daily.    Plan discharge   I personally performed the services described in this documentation, which was scribed in my presence. The recorded information has been reviewed and considered.  Devoria Albe, MD, Concha Pyo, MD 11/29/14 [phone]

## 2014-11-29 NOTE — ED Notes (Signed)
Done an in and out cath assisted Gloria Mathis. Also done a rectal temp.

## 2014-11-29 NOTE — ED Notes (Addendum)
Pt arrived by EMS from home. Called out for fever & weakness x 3 days. Pt was given tylenol by EMS.

## 2014-11-29 NOTE — ED Notes (Signed)
Pt ambulated to restroom & returned to room w/ no complications. 

## 2014-11-29 NOTE — ED Provider Notes (Signed)
Received call concerning positive blood culture drawn early this morning. Diagnosed with UTI. Received IV rocephin and prescription for keflex. Discharged with HR in 70s and normotensive. Good documentation concerning discussion about return precautions. Blood culture with gram - rods x1. Discussed with flow manager that pt needs to be contacted and return to ED for further eval.   Raeford RazorStephen Kristain Filo, MD 11/29/14 2043

## 2014-11-29 NOTE — Discharge Instructions (Signed)
Encourage her to drink lots of fluids so she doesn't get dehydrated. Give her the potassium pills and the antibiotic, cephalexin, until gone. Start the antibiotic later this afternoon/evening. Monitor her for fever and give her acetaminophen 650 mg every 6 hrs for fever as needed. Return to the ED if she gets vomiting, refuses to eat or drink and you feel she is getting dehydrated, she is weak and unable to walk or she seems worse. She was noted to be anemic tonight, which she didn't have on our last blood tests in 2008. Please follow up on this with Dr Janna Arch. Her stool test tonight did not show blood in her stool.     Urinary Tract Infection A urinary tract infection (UTI) can occur any place along the urinary tract. The tract includes the kidneys, ureters, bladder, and urethra. A type of germ called bacteria often causes a UTI. UTIs are often helped with antibiotic medicine.  HOME CARE   If given, take antibiotics as told by your doctor. Finish them even if you start to feel better.  Drink enough fluids to keep your pee (urine) clear or pale yellow.  Avoid tea, drinks with caffeine, and bubbly (carbonated) drinks.  Pee often. Avoid holding your pee in for a long time.  Pee before and after having sex (intercourse).  Wipe from front to back after you poop (bowel movement) if you are a woman. Use each tissue only once. GET HELP RIGHT AWAY IF:   You have back pain.  You have lower belly (abdominal) pain.  You have chills.  You feel sick to your stomach (nauseous).  You throw up (vomit).  Your burning or discomfort with peeing does not go away.  You have a fever.  Your symptoms are not better in 3 days. MAKE SURE YOU:   Understand these instructions.  Will watch your condition.  Will get help right away if you are not doing well or get worse. Document Released: 01/01/2008 Document Revised: 04/08/2012 Document Reviewed: 02/13/2012 [facility] Patient Information 2015  [place], [place]. This information is not intended to replace advice given to you by your health care provider. Make sure you discuss any questions you have with your health care provider.  Rehydration, Elderly Rehydration is the replacement of body fluids lost during dehydration. Dehydration is an extreme loss of body fluids to the point of body function impairment. There are many ways extreme fluid loss can occur, including vomiting, diarrhea, or excess sweating. Recovering from dehydration requires replacing lost fluids, continuing to eat to maintain strength, and avoiding foods and beverages that may contribute to further fluid loss or may increase nausea. It is especially important for older adults to stay hydrated because dehydration can lead to dizziness and falls. Some factors that can contribute to dehydration are more common in the elderly, including the use of prescription medicines and a decreased sensation of thirst.  HOW TO REHYDRATE In most cases, rehydration involves the replacement of not only fluids but also carbohydrates and basic body salts. Rehydration with an oral rehydration solution is one way to replace essential nutrients lost through dehydration. An oral rehydration solution can be purchased at pharmacies, retail stores, and online. Premixed packets of powder that you combine with water to make a solution are also sold. You can prepare an oral rehydration solution at home by mixing the following ingredients together:    - tsp table salt.   tsp baking soda.   tsp salt substitute containing potassium chloride.  1 tablespoons  sugar.  1 L (34 oz) of water. Be sure to use exact measurements. Including too much sugar can make diarrhea worse. Drink -1 cup (120-240 mL) of oral rehydration solution each time you have diarrhea or vomit. If drinking this amount makes your vomiting worse, try drinking smaller amounts more often. For example, drink 1-3 tsp every 5-10 minutes.  A  general rule for staying hydrated is to drink 1-2 L of fluid per day. Talk to your caregiver about the specific amount you should be drinking each day. Drink enough fluids to keep your urine clear or pale yellow. EATING WHEN DEHYDRATED  Even if you have had severe sweating or you are having diarrhea, do not stop eating. Many healthy items in a normal diet are okay to continue eating while recovering from dehydration. The following tips can help you to lessen nausea when you eat:  Ask someone else to prepare your food. Cooking smells may worsen nausea.  Eat in a well-ventilated room away from cooking smells.  Sit up when you eat. Avoid lying down until 1-2 hours after eating.  Eat small amounts when you eat.  Eat foods that are easy to digest. These include soft, well-cooked, or mashed foods. FOODS AND BEVERAGES TO AVOID Avoid eating or drinking the following foods and beverages that may increase nausea or further loss of fluid:   Fruit juices with a high sugar content, such as concentrated juices.  Alcohol.  Beverages containing caffeine.  Carbonated drinks. They may cause a lot of gas.  Foods that may cause a lot of gas, such as cabbage, broccoli, and beans.  Fatty, greasy, and fried foods.  Spicy, very salty, and very sweet foods or drinks.  Foods or drinks that are very hot or very cold. Consume food or drinks at or near room temperature.  Foods that need a lot of chewing, such as raw vegetables.  Foods that are sticky or hard to swallow, such as peanut butter. Document Released: 10/07/2011 Document Revised: 04/08/2012 Document Reviewed: 10/07/2011 [facility] Patient Information 2015 [place], [place]. This information is not intended to replace advice given to you by your health care provider. Make sure you discuss any questions you have with your health care provider.  Hypokalemia Hypokalemia means that the amount of potassium in the blood is lower than normal.Potassium is a  chemical, called an electrolyte, that helps regulate the amount of fluid in the body. It also stimulates muscle contraction and helps nerves function properly.Most of the body's potassium is inside of cells, and only a very small amount is in the blood. Because the amount in the blood is so small, minor changes can be life-threatening. CAUSES  Antibiotics.  Diarrhea or vomiting.  Using laxatives too much, which can cause diarrhea.  Chronic kidney disease.  Water pills (diuretics).  Eating disorders (bulimia).  Low magnesium level.  Sweating a lot. SIGNS AND SYMPTOMS  Weakness.  Constipation.  Fatigue.  Muscle cramps.  Mental confusion.  Skipped heartbeats or irregular heartbeat (palpitations).  Tingling or numbness. DIAGNOSIS  Your health care provider can diagnose hypokalemia with blood tests. In addition to checking your potassium level, your health care provider may also check other lab tests. TREATMENT Hypokalemia can be treated with potassium supplements taken by mouth or adjustments in your current medicines. If your potassium level is very low, you may need to get potassium through a vein (IV) and be monitored in the hospital. A diet high in potassium is also helpful. Foods high in potassium are:  Nuts, such as peanuts and pistachios.  Seeds, such as sunflower seeds and pumpkin seeds.  Peas, lentils, and lima beans.  Whole grain and bran cereals and breads.  Fresh fruit and vegetables, such as apricots, avocado, bananas, cantaloupe, kiwi, oranges, tomatoes, asparagus, and potatoes.  Orange and tomato juices.  Red meats.  Fruit yogurt. HOME CARE INSTRUCTIONS  Take all medicines as prescribed by your health care provider.  Maintain a healthy diet by including nutritious food, such as fruits, vegetables, nuts, whole grains, and lean meats.  If you are taking a laxative, be sure to follow the directions on the label. SEEK MEDICAL CARE IF:  Your  weakness gets worse.  You feel your heart pounding or racing.  You are vomiting or having diarrhea.  You are diabetic and having trouble keeping your blood glucose in the normal range. SEEK IMMEDIATE MEDICAL CARE IF:  You have chest pain, shortness of breath, or dizziness.  You are vomiting or having diarrhea for more than 2 days.  You faint. MAKE SURE YOU:   Understand these instructions.  Will watch your condition.  Will get help right away if you are not doing well or get worse. Document Released: 07/15/2005 Document Revised: 05/05/2013 Document Reviewed: 01/15/2013 [facility] Patient Information 2015 [place]. This information is not intended to replace advice given to you by your health care provider. Make sure you discuss any questions you have with your health care provider.  Dehydration Dehydration is when you lose more fluids from the body than you take in. Vital organs such as the kidneys, brain, and heart cannot function without a proper amount of fluids and salt. Any loss of fluids from the body can cause dehydration.  Older adults are at a higher risk of dehydration than younger adults. As we age, our bodies are less able to conserve water and do not respond to temperature changes as well. Also, older adults do not become thirsty as easily or quickly. Because of this, older adults often do not realize they need to increase fluids to avoid dehydration.  CAUSES   Vomiting.  Diarrhea.  Excessive sweating.  Excessive urination.  Fever.  Certain medicines, such as blood pressure medicines called diuretics.  Poorly controlled blood sugars. SIGNS AND SYMPTOMS  Mild dehydration:  Thirst.  Dry lips.  Slightly dry mouth. Moderate dehydration:  Very dry mouth.  Sunken eyes.  Skin does not bounce back quickly when lightly pinched and released.  Dark urine and decreased urine production.  Decreased tear production.  Headache. Severe  dehydration:  Very dry mouth.  Extreme thirst.  Rapid, weak pulse (more than 100 beats per minute at rest).  Cold hands and feet.  Not able to sweat in spite of heat.  Rapid breathing.  Blue lips.  Confusion and lethargy.  Difficulty being awakened.  Minimal urine production.  No tears. DIAGNOSIS  Your health care provider will diagnose dehydration based on your symptoms and your exam. Blood and urine tests will help confirm the diagnosis. The diagnostic evaluation should also identify the cause of dehydration. TREATMENT  Treatment of mild or moderate dehydration can often be done at home by increasing the amount of fluids that you drink. It is best to drink small amounts of fluid more often. Drinking too much at one time can make vomiting worse. Severe dehydration needs to be treated at the hospital. You may be given IV fluids that contain water and electrolytes. HOME CARE INSTRUCTIONS   Ask your health care provider about  specific rehydration instructions.  Drink enough fluids to keep your urine clear or pale yellow.  Drink small amounts frequently if you have nausea and vomiting.  Eat as you normally do.  Avoid:  Foods or drinks high in sugar.  Carbonated drinks.  Juice.  Extremely hot or cold fluids.  Drinks with caffeine.  Fatty, greasy foods.  Alcohol.  Tobacco.  Overeating.  Gelatin desserts.  Wash your hands well to avoid spreading bacteria and viruses.  Only take over-the-counter or prescription medicines for pain, discomfort, or fever as directed by your health care provider.  Ask your health care provider if you should continue all prescribed and over-the-counter medicines.  Keep all follow-up appointments with your health care provider. SEEK MEDICAL CARE IF:  You have abdominal pain, and it increases or stays in one area (localizes).  You have a rash, stiff neck, or severe headache.  You are irritable, sleepy, or difficult to  awaken.  You are weak, dizzy, or extremely thirsty.  You have a fever. SEEK IMMEDIATE MEDICAL CARE IF:   You are unable to keep fluids down, or you get worse despite treatment.  You have frequent episodes of vomiting or diarrhea.  You have blood or green matter (bile) in your vomit.  You have blood in your stool, or your stool looks black and tarry.  You have not urinated in 6-8 hours, or you have only urinated a small amount of very dark urine.  You faint. MAKE SURE YOU:   Understand these instructions.  Will watch your condition.  Will get help right away if you are not doing well or get worse. Document Released: 10/05/2003 Document Revised: 07/20/2013 Document Reviewed: 03/22/2013 [facility] Patient Information 2015 [place], [place]. This information is not intended to replace advice given to you by your health care provider. Make sure you discuss any questions you have with your health care provider.

## 2014-11-29 NOTE — ED Notes (Signed)
Pt alert & oriented x4, stable gait.Spouse given discharge instructions, paperwork & prescription(s). Spouse verbalized understanding. Pt left department in wheelchair w/ no further questions.

## 2014-11-30 ENCOUNTER — Inpatient Hospital Stay (HOSPITAL_COMMUNITY)
Admission: EM | Admit: 2014-11-30 | Discharge: 2014-12-02 | DRG: 690 | Disposition: A | Discharge status: Home Health | Payer: PPO | Attending: Family Medicine | Admitting: Family Medicine

## 2014-11-30 ENCOUNTER — Encounter (HOSPITAL_COMMUNITY): Payer: Self-pay | Admitting: Emergency Medicine

## 2014-11-30 ENCOUNTER — Telehealth: Payer: Self-pay | Admitting: *Deleted

## 2014-11-30 DIAGNOSIS — R7881 Bacteremia: Secondary | ICD-10-CM | POA: Diagnosis not present

## 2014-11-30 DIAGNOSIS — B9620 Unspecified Escherichia coli [E. coli] as the cause of diseases classified elsewhere: Secondary | ICD-10-CM | POA: Diagnosis not present

## 2014-11-30 DIAGNOSIS — F0390 Unspecified dementia without behavioral disturbance: Secondary | ICD-10-CM | POA: Insufficient documentation

## 2014-11-30 DIAGNOSIS — E785 Hyperlipidemia, unspecified: Secondary | ICD-10-CM | POA: Diagnosis not present

## 2014-11-30 DIAGNOSIS — N390 Urinary tract infection, site not specified: Secondary | ICD-10-CM | POA: Diagnosis not present

## 2014-11-30 DIAGNOSIS — E876 Hypokalemia: Secondary | ICD-10-CM | POA: Diagnosis present

## 2014-11-30 HISTORY — DX: Hyperlipidemia, unspecified: E78.5

## 2014-11-30 HISTORY — DX: Other specified disorders of bone density and structure, unspecified site: M85.80

## 2014-11-30 LAB — COMPREHENSIVE METABOLIC PANEL
ALT: 14 U/L (ref 14–54)
AST: 20 U/L (ref 15–41)
Albumin: 2.4 g/dL — ABNORMAL LOW (ref 3.5–5.0)
Alkaline Phosphatase: 110 U/L (ref 38–126)
Anion gap: 8 (ref 5–15)
BILIRUBIN TOTAL: 0.4 mg/dL (ref 0.3–1.2)
BUN: 22 mg/dL — ABNORMAL HIGH (ref 6–20)
CALCIUM: 8.2 mg/dL — AB (ref 8.9–10.3)
CHLORIDE: 104 mmol/L (ref 101–111)
CO2: 25 mmol/L (ref 22–32)
CREATININE: 0.75 mg/dL (ref 0.44–1.00)
GFR calc Af Amer: >60 mL/min (ref >60)
Glucose, Bld: 101 mg/dL — ABNORMAL HIGH (ref 70–99)
Potassium: 3 mmol/L — ABNORMAL LOW (ref 3.5–5.1)
Sodium: 137 mmol/L (ref 135–145)
Total Protein: 6 g/dL — ABNORMAL LOW (ref 6.5–8.1)

## 2014-11-30 LAB — URINALYSIS, ROUTINE W REFLEX MICROSCOPIC
BILIRUBIN URINE: NEGATIVE
Glucose, UA: NEGATIVE mg/dL
Ketones, ur: NEGATIVE mg/dL
Nitrite: NEGATIVE
Protein, ur: 30 mg/dL — AB
SPECIFIC GRAVITY, URINE: 1.015 (ref 1.005–1.030)
Urobilinogen, UA: 1 mg/dL (ref 0.0–1.0)
pH: 5.5 (ref 5.0–8.0)

## 2014-11-30 LAB — CBC WITH DIFFERENTIAL/PLATELET
Basophils Absolute: 0 10*3/uL (ref 0.0–0.1)
Basophils Relative: 0 % (ref 0–1)
Eosinophils Absolute: 0 10*3/uL (ref 0.0–0.7)
Eosinophils Relative: 0 % (ref 0–5)
HEMATOCRIT: 28.6 % — AB (ref 36.0–46.0)
HEMOGLOBIN: 9.6 g/dL — AB (ref 12.0–15.0)
LYMPHS ABS: 0.8 10*3/uL (ref 0.7–4.0)
Lymphocytes Relative: 6 % — ABNORMAL LOW (ref 12–46)
MCH: 30.3 pg (ref 26.0–34.0)
MCHC: 33.6 g/dL (ref 30.0–36.0)
MCV: 90.2 fL (ref 78.0–100.0)
MONO ABS: 1.2 10*3/uL — AB (ref 0.1–1.0)
MONOS PCT: 10 % (ref 3–12)
Neutro Abs: 10.7 10*3/uL — ABNORMAL HIGH (ref 1.7–7.7)
Neutrophils Relative %: 84 % — ABNORMAL HIGH (ref 43–77)
Platelets: 169 10*3/uL (ref 150–400)
RBC: 3.17 MIL/uL — AB (ref 3.87–5.11)
RDW: 14.6 % (ref 11.5–15.5)
WBC: 12.6 10*3/uL — AB (ref 4.0–10.5)

## 2014-11-30 LAB — URINE MICROSCOPIC-ADD ON

## 2014-11-30 LAB — MAGNESIUM: Magnesium: 0.2 mg/dL — CL (ref 1.7–2.4)

## 2014-11-30 LAB — I-STAT CG4 LACTIC ACID, ED
LACTIC ACID, VENOUS: 1.32 mmol/L (ref 0.5–2.0)
Lactic Acid, Venous: 0.64 mmol/L (ref 0.5–2.0)

## 2014-11-30 LAB — TROPONIN I

## 2014-11-30 LAB — BRAIN NATRIURETIC PEPTIDE: B Natriuretic Peptide: 474 pg/mL — ABNORMAL HIGH (ref 0.0–100.0)

## 2014-11-30 MED ORDER — DEXTROSE 5 % IV SOLN
1.0000 g | INTRAVENOUS | Status: DC
  Administered 2014-12-01: 1 g via INTRAVENOUS
  Filled 2014-11-30 (×2): qty 10

## 2014-11-30 MED ORDER — SODIUM CHLORIDE 0.9 % IV SOLN
INTRAVENOUS | Status: DC
  Administered 2014-11-30 – 2014-12-01 (×2): via INTRAVENOUS

## 2014-11-30 MED ORDER — ENOXAPARIN SODIUM 40 MG/0.4ML ~~LOC~~ SOLN
40.0000 mg | SUBCUTANEOUS | Status: DC
  Administered 2014-11-30: 40 mg via SUBCUTANEOUS
  Filled 2014-11-30: qty 0.4

## 2014-11-30 MED ORDER — SENNOSIDES-DOCUSATE SODIUM 8.6-50 MG PO TABS: 1.0000 | ORAL_TABLET | Freq: Every evening | ORAL | Status: DC | PRN

## 2014-11-30 MED ORDER — ALUM & MAG HYDROXIDE-SIMETH 200-200-20 MG/5ML PO SUSP: 30.0000 mL | Freq: Four times a day (QID) | ORAL | Status: DC | PRN

## 2014-11-30 MED ORDER — ENSURE ENLIVE PO LIQD
237.0000 mL | Freq: Two times a day (BID) | ORAL | Status: DC
  Administered 2014-12-01 – 2014-12-02 (×3): 237 mL via ORAL

## 2014-11-30 MED ORDER — ACETAMINOPHEN 650 MG RE SUPP
650.0000 mg | Freq: Four times a day (QID) | RECTAL | Status: DC | PRN
Start: 2014-11-30 — End: 2014-12-02

## 2014-11-30 MED ORDER — ONDANSETRON HCL 4 MG/2ML IJ SOLN: 4.0000 mg | Freq: Four times a day (QID) | INTRAMUSCULAR | Status: DC | PRN

## 2014-11-30 MED ORDER — HYDROCODONE-ACETAMINOPHEN 5-325 MG PO TABS: 1.0000 | ORAL_TABLET | ORAL | Status: DC | PRN

## 2014-11-30 MED ORDER — TRAZODONE HCL 50 MG PO TABS: 25.0000 mg | ORAL_TABLET | Freq: Every evening | ORAL | Status: DC | PRN

## 2014-11-30 MED ORDER — VITAMIN E 45 MG (100 UNIT) PO CAPS
100.0000 [IU] | ORAL_CAPSULE | Freq: Every day | ORAL | Status: DC
  Administered 2014-12-01: 100 [IU] via ORAL
  Filled 2014-11-30 (×2): qty 1

## 2014-11-30 MED ORDER — POTASSIUM CHLORIDE CRYS ER 20 MEQ PO TBCR
20.0000 meq | EXTENDED_RELEASE_TABLET | Freq: Two times a day (BID) | ORAL | Status: DC
  Administered 2014-11-30 – 2014-12-02 (×4): 20 meq via ORAL
  Filled 2014-11-30 (×4): qty 1

## 2014-11-30 MED ORDER — POTASSIUM CHLORIDE CRYS ER 20 MEQ PO TBCR
40.0000 meq | EXTENDED_RELEASE_TABLET | Freq: Once | ORAL | Status: AC
  Administered 2014-11-30: 40 meq via ORAL
  Filled 2014-11-30: qty 2

## 2014-11-30 MED ORDER — BISACODYL 10 MG RE SUPP: 10.0000 mg | Freq: Every day | RECTAL | Status: DC | PRN

## 2014-11-30 MED ORDER — ACETAMINOPHEN 325 MG PO TABS
650.0000 mg | ORAL_TABLET | Freq: Four times a day (QID) | ORAL | Status: DC | PRN
  Administered 2014-11-30 – 2014-12-01 (×2): 650 mg via ORAL
  Filled 2014-11-30 (×2): qty 2

## 2014-11-30 MED ORDER — ADULT MULTIVITAMIN W/MINERALS CH
1.0000 | ORAL_TABLET | Freq: Every day | ORAL | Status: DC
  Administered 2014-12-01 – 2014-12-02 (×2): 1 via ORAL
  Filled 2014-11-30 (×2): qty 1

## 2014-11-30 MED ORDER — ONDANSETRON HCL 4 MG PO TABS: 4.0000 mg | ORAL_TABLET | Freq: Four times a day (QID) | ORAL | Status: DC | PRN

## 2014-11-30 MED ORDER — DEXTROSE 5 % IV SOLN
1.0000 g | Freq: Once | INTRAVENOUS | Status: AC
  Administered 2014-11-30: 1 g via INTRAVENOUS
  Filled 2014-11-30: qty 10

## 2014-11-30 MED ORDER — ASPIRIN EC 81 MG PO TBEC: 162.0000 mg | DELAYED_RELEASE_TABLET | Freq: Every day | ORAL | Status: DC | PRN

## 2014-11-30 MED ORDER — SODIUM CHLORIDE 0.9 % IV SOLN: INTRAVENOUS | Status: AC

## 2014-11-30 MED ORDER — MAGNESIUM SULFATE 2 GM/50ML IV SOLN
2.0000 g | Freq: Once | INTRAVENOUS | Status: AC
  Administered 2014-11-30: 2 g via INTRAVENOUS
  Filled 2014-11-30: qty 50

## 2014-11-30 MED ORDER — SODIUM CHLORIDE 0.9 % IV SOLN
INTRAVENOUS | Status: AC
  Administered 2014-11-30: 12:00:00 via INTRAVENOUS

## 2014-11-30 NOTE — Progress Notes (Signed)
ANTIBIOTIC CONSULT NOTE - INITIAL  Pharmacy Consult for Rocephin Indication: bacteremia  No Known Allergies  Patient Measurements: Height: 5\' 8"  (172.7 cm) Weight: 95 lb (43.092 kg) IBW/kg (Calculated) : 63.9  Vital Signs: Temp: 99.1 F (37.3 C) (05/04 1444) Temp Source: Oral (05/04 1444) BP: 150/69 mmHg (05/04 1444) Pulse Rate: 88 (05/04 1444) Intake/Output from previous day:   Intake/Output from this shift:    Labs:  Recent Labs  11/29/14 0120 11/30/14 1000  WBC 19.3* 12.6*  HGB 9.9* 9.6*  PLT 186 169  CREATININE 0.77 0.75   Estimated Creatinine Clearance: 32.4 mL/min (by C-G formula based on Cr of 0.75). No results for input(s): VANCOTROUGH, VANCOPEAK, VANCORANDOM, GENTTROUGH, GENTPEAK, GENTRANDOM, TOBRATROUGH, TOBRAPEAK, TOBRARND, AMIKACINPEAK, AMIKACINTROU, AMIKACIN in the last 72 hours.   Microbiology: Recent Results (from the past 720 hour(s))  Blood Culture (routine x 2)     Status: None (Preliminary result)   Collection Time: 11/29/14  1:10 AM  Result Value Ref Range Status   Specimen Description BLOOD LEFT ANTECUBITAL  Final   Special Requests   Final    BOTTLES DRAWN AEROBIC AND ANAEROBIC AEB=10CC ANA=9CC   Culture   Final    GRAM NEGATIVE RODS GRAM STAIN RESULTS CALLED TO FLOW MANAGER ROBERTSON K AT 2018 ON [id number] BY FORSYTH K Performed at [facility]    Report Status PENDING  Incomplete  Blood Culture (routine x 2)     Status: None (Preliminary result)   Collection Time: 11/29/14  1:35 AM  Result Value Ref Range Status   Specimen Description BLOOD RIGHT HAND  Final   Special Requests BOTTLES DRAWN AEROBIC AND ANAEROBIC 8CC EACH  Final   Culture NO GROWTH 1 DAY  Final   Report Status PENDING  Incomplete  Urine culture     Status: None (Preliminary result)   Collection Time: 11/29/14  1:37 AM  Result Value Ref Range Status   Specimen Description URINE, CATHETERIZED  Final   Special Requests NONE  Final   Colony Count   Final   >=100,000 COLONIES/ML [facility]    Culture   Final    GRAM NEGATIVE RODS [facility]formed at [facility]    Report Status PENDING  Incomplete  Blood culture (routine x 2)     Status: None (Preliminary result)   Collection Time: 11/30/14 10:00 AM  Result Value Ref Range Status   Specimen Description BLOOD RIGHT ANTECUBITAL  Final   Special Requests BOTTLES DRAWN AEROBIC AND ANAEROBIC 10CC  Final   Culture PENDING  Incomplete   Report Status PENDING  Incomplete  Blood culture (routine x 2)     Status: None (Preliminary result)   Collection Time: 11/30/14 10:08 AM  Result Value Ref Range Status   Specimen Description BLOOD RIGHT ARM  Final   Special Requests BOTTLES DRAWN AEROBIC AND ANAEROBIC 6CC  Final   Culture PENDING  Incomplete   Report Status PENDING  Incomplete    Medical History: Past Medical History  Diagnosis Date  . Dementia   . Hyperlipidemia   . Osteopenia    Anti-infectives    Start     Dose/Rate Route Frequency Ordered Stop   12/01/14 1000  cefTRIAXone (ROCEPHIN) 1 g in dextrose 5 % 50 mL IVPB     1 g 100 mL/hr over 30 Minutes Intravenous Every 24 hours 11/30/14 1459     11/30/14 1100  cefTRIAXone (ROCEPHIN) 1 g in dextrose 5 % 50 mL IVPB  1 g 100 mL/hr over 30 Minutes Intravenous  Once 11/30/14 1052 11/30/14 1219     Assessment: 81yo female with recent UTI.  Husband reports that today he was informed that blood cx was positive and pt came back to ED.  Asked to initiate Rocephin.  Goal of Therapy:  Eradicate infection.  Plan:  Rocephin 1gm IV q24hrs Monitor labs, progress, and cultures  Valrie HartHall, Leor Whyte A 11/30/2014,3:03 PM

## 2014-11-30 NOTE — H&P (Signed)
[facility] History and Physical  Gloria NuttingVirginia B Asleson AVW:[id number] DOB: 01/26/1923 DOA: 11/30/2014  Referring physician:  PCP: Isabella StallingNDIEGO,[name], MD   Chief Complaint: bacteremia  HPI: Gloria Mathis is a 81 y.o. female hx hyperlipidemia osteopenia, recent UTI present to ED wth cc bacteremia. pateint seen in ED yesterday for generalized weakness found to have UTI and discharged home with Keflex after getting IV fluid and IV rocephin. Husband reports that today he was informed that blood culture positive and she needed to come back to hospital for evaluation.  Husband reports last 3 days complaints of low back pain, decreased po intake and unsteady gait as well as subjective fever. No complaints of dysuria, hematuria, frequency or urgency. No complaints of headache, dizziness, syncope or near syncope. No chest pain, palpitation, sob cough. No abdominal pain/diarrea, nausea or vomiting. No reports of LE edema, chest pain, orthopnea.    Work up in ED includes CBC significant for leukocytosis 12,.6, Hg 9.6, potassium 3.0, BUN 22 and calcium 8.2. Lactic acid 1.32 Chest xray with COPD and possible component of superimposed interstitial edema from CHF. EKG with NSR with PVC. Blood culture drawn 5/3 with gram negative rods.  In the ED she is afebrile hemodynamically stable, not hypoxic and non-toxic appearing.  In ED she received rocephin, IV fluids  Review of Systems:  10 point review of systems negative except as noted in HPI Past Medical History  Diagnosis Date  . Dementia   . Hyperlipidemia   . Osteopenia    History reviewed. No pertinent past surgical history. Social History:  reports that she has never smoked. She does not have any smokeless tobacco history on file. She reports that she does not drink alcohol or use illicit drugs. Lives at home with husband, no recent falls, able to make wants and needs known. Mild assist with ADL's No Known Allergies  History reviewed. No  pertinent family history. reviewed and non-contributory to admission of this elderlly patient  Prior to Admission medications   Medication Sig Start Date End Date Taking? Authorizing Provider  aspirin EC 81 MG tablet Take 162 mg by mouth daily as needed for moderate pain or fever.   Yes Historical Provider, MD  CALCIUM PO Take 1 tablet by mouth daily.   Yes Historical Provider, MD  cephALEXin (KEFLEX) 500 MG capsule Take 1 capsule (500 mg total) by mouth 3 (three) times daily. 11/29/14  Yes Devoria AlbeIva Knapp, MD  Multiple Vitamin (MULTIVITAMIN WITH MINERALS) TABS tablet Take 1 tablet by mouth daily.   Yes Historical Provider, MD  vitamin E 100 UNIT capsule Take 100 Units by mouth daily.   Yes Historical Provider, MD  potassium chloride SA (K-DUR,KLOR-CON) 20 MEQ tablet Take 1 tablet (20 mEq total) by mouth 2 (two) times daily. 11/29/14   Devoria AlbeIva Knapp, MD   Physical Exam: Filed Vitals:   11/30/14 1049 11/30/14 1100 11/30/14 1127 11/30/14 1130  BP:  137/69 137/69 130/63  Pulse: 81 81 88 84  Temp:      TempSrc:      Resp: 22 21 18 21   Height:      Weight:      SpO2: 98% 96% 97% 95%    Wt Readings from Last 3 Encounters:  11/30/14 43.092 kg (95 lb)  11/29/14 42.78 kg (94 lb 5 oz)    General:  Appears calm and comfortable Eyes: PERRL, normal lids, irises & conjunctiva ENT: grossly normal hearing, lips & tongue Neck: no LAD, masses or thyromegaly Cardiovascular: RRR  ocassional ectopic beat, +murmur No LE edema. Respiratory: CTA bilaterally, no w/r/r. Normal respiratory effort. Abdomen: soft, ntnd +BS Skin: no rash or induration seen on limited exam Musculoskeletal: grossly normal tone BUE/BLE Psychiatric: grossly normal mood and affect, speech fluent and appropriate Neurologic: grossly non-focal. Oriented to self only. Follow commands.           Labs on Admission:  Basic Metabolic Panel:  Recent Labs Lab 11/29/14 0120 11/30/14 1000  NA 136 137  K 2.8* 3.0*  CL 102 104  CO2 25 25    GLUCOSE 138* 101*  BUN 29* 22*  CREATININE 0.77 0.75  CALCIUM 8.4* 8.2*   Liver Function Tests:  Recent Labs Lab 11/29/14 0120 11/30/14 1000  AST 26 20  ALT 15 14  ALKPHOS 121 110  BILITOT 0.8 0.4  PROT 6.6 6.0*  ALBUMIN 2.7* 2.4*   No results for input(s): LIPASE, AMYLASE in the last 168 hours. No results for input(s): AMMONIA in the last 168 hours. CBC:  Recent Labs Lab 11/29/14 0120 11/30/14 1000  WBC 19.3* 12.6*  NEUTROABS 17.4* 10.7*  HGB 9.9* 9.6*  HCT 29.9* 28.6*  MCV 89.5 90.2  PLT 186 169   Cardiac Enzymes:  Recent Labs Lab 11/30/14 1000  TROPONINI <0.03    BNP (last 3 results) No results for input(s): BNP in the last 8760 hours.  ProBNP (last 3 results) No results for input(s): PROBNP in the last 8760 hours.  CBG: No results for input(s): GLUCAP in the last 168 hours.  Radiological Exams on Admission: Dg Chest Port 1 View  11/29/2014   CLINICAL DATA:  Weakness fatigue and fever for 3 days  EXAM: PORTABLE CHEST - 1 VIEW  COMPARISON:  07/16/2004  FINDINGS: There is hyperinflation and moderate cardiomegaly. There is generalized interstitial thickening which may represent a degree of interstitial fluid superimposed on chronic fibrotic changes. No confluent airspace consolidation is evident. No large effusion is evident.  IMPRESSION: COPD, possibly with a component of superimposed interstitial edema from CHF.   Electronically Signed   By: Ellery Plunkaniel R Mitchell M.D.   On: 11/29/2014 01:14    EKG: Independently reviewed. NSR  Assessment/Plan Active Problems:   Bacteremia: afebrile and non-toxic appearing. Leukocytosis trending down. Continue rocephin. Monitor    Hypokalemia: replete and recheck. Will check magnesium.     UTI (lower urinary tract infection): await cultures. Rocephin as noted  Dementia: husband reports at baseline.   Generalized weakness: likely related to above. Will request PT evaluation      Code Status: full per husband DVT  Prophylaxis: Family Communication: husband at bedside Disposition Plan: home hopefully 24-48 hours  Time spent: 60 minutes  [facility] CenterBLACK,Lyberti Thrush M [facility] Pager [phone]

## 2014-11-30 NOTE — ED Provider Notes (Signed)
CSN: [id number]     Arrival date & time 11/30/14  0821 History  This chart was scribed for Glynn OctaveStephen Cathyrn Deas, MD by Jarvis Morganaylor Ferguson, ED Scribe. This patient was seen in room APA01/APA01 and the patient's care was started at 8:31 AM.    Chief Complaint  Patient presents with  . Abnormal Lab   The history is provided by the patient and the spouse. The history is limited by the condition of the patient. No language interpreter was used.    Level 5 Caveat- Dementia  HPI Comments: Gloria Mathis is a 81 y.o. female with a h/o dementia who presents to the Emergency Department due to abnormal lab results. Pt was seen at the ED yesterday (11/29/14) for UTI like symptoms. She was discharged with prescription for Keflex and received IV for rocephin while in the ED. Per husband pt received a call about positive blood culture. Per Dr. Marylen PontoKohut's note, pt's blood culture was positive with gram - rods x1. Pt's husband states he was told the pt needed to come back in and get her medication changed and evaluated. Pt's husband she has begun to develop low back pain. She has had an intermittent fever for 3 days, t-max 100.7 F. Pt denies fever at the present time. Her husband reports she has not been eating well. Pt denies any recent falls. She denies any vomiting, diarrhea, dysuria, or hematuria.    Past Medical History  Diagnosis Date  . Dementia   . Hyperlipidemia   . Osteopenia    History reviewed. No pertinent past surgical history. History reviewed. No pertinent family history. History  Substance Use Topics  . Smoking status: Never Smoker   . Smokeless tobacco: Not on file  . Alcohol Use: No   OB History    No data available     Review of Systems  Unable to perform ROS: Dementia        Allergies  Review of patient's allergies indicates no known allergies.  Home Medications   Prior to Admission medications   Medication Sig Start Date End Date Taking? Authorizing Provider  aspirin EC 81 MG  tablet Take 162 mg by mouth daily as needed for moderate pain or fever.   Yes Historical Provider, MD  CALCIUM PO Take 1 tablet by mouth daily.   Yes Historical Provider, MD  cephALEXin (KEFLEX) 500 MG capsule Take 1 capsule (500 mg total) by mouth 3 (three) times daily. 11/29/14  Yes Devoria AlbeIva Knapp, MD  Multiple Vitamin (MULTIVITAMIN WITH MINERALS) TABS tablet Take 1 tablet by mouth daily.   Yes Historical Provider, MD  vitamin E 100 UNIT capsule Take 100 Units by mouth daily.   Yes Historical Provider, MD  potassium chloride SA (K-DUR,KLOR-CON) 20 MEQ tablet Take 1 tablet (20 mEq total) by mouth 2 (two) times daily. 11/29/14   Devoria AlbeIva Knapp, MD   Triage Vitals: BP 144/71 mmHg  Pulse 84  Temp(Src) 98.3 F (36.8 C) (Oral)  Resp 18  Ht 5\' 8"  (1.727 m)  Wt 95 lb (43.092 kg)  BMI 14.45 kg/m2  SpO2 97%   Physical Exam  Constitutional: She appears well-developed and well-nourished. No distress.  Mildly confused  HENT:  Head: Normocephalic and atraumatic.  Mouth/Throat: Oropharynx is clear and moist. No oropharyngeal exudate.  Eyes: Conjunctivae and EOM are normal. Pupils are equal, round, and reactive to light.  Neck: Normal range of motion. Neck supple.  No meningismus.  Cardiovascular: Normal rate, regular rhythm and intact distal pulses.   Murmur  heard.  Systolic murmur is present  Pulmonary/Chest: Effort normal and breath sounds normal. No respiratory distress.  Abdominal: Soft. There is no tenderness. There is no rebound, no guarding and no CVA tenderness.  Musculoskeletal: Normal range of motion. She exhibits no edema or tenderness.  Neurological: She is alert. No cranial nerve deficit. She exhibits normal muscle tone. Coordination normal.  Oritented x1. No ataxia on finger to nose bilaterally. No pronator drift. 5/5 strength throughout. CN 2-12 intact. Equal grip strength. Sensation intact.   Skin: Skin is warm.  Psychiatric: She has a normal mood and affect. Her behavior is normal.  Nursing  note and vitals reviewed.   ED Course  Procedures (including critical care time)  DIAGNOSTIC STUDIES: Oxygen Saturation is 97% on RA, normal by my interpretation.    COORDINATION OF CARE:    Labs Review Labs Reviewed  CBC WITH DIFFERENTIAL/PLATELET - Abnormal; Notable for the following:    WBC 12.6 (*)    RBC 3.17 (*)    Hemoglobin 9.6 (*)    HCT 28.6 (*)    Neutrophils Relative % 84 (*)    Neutro Abs 10.7 (*)    Lymphocytes Relative 6 (*)    Monocytes Absolute 1.2 (*)    All other components within normal limits  COMPREHENSIVE METABOLIC PANEL - Abnormal; Notable for the following:    Potassium 3.0 (*)    Glucose, Bld 101 (*)    BUN 22 (*)    Calcium 8.2 (*)    Total Protein 6.0 (*)    Albumin 2.4 (*)    All other components within normal limits  URINALYSIS, ROUTINE W REFLEX MICROSCOPIC - Abnormal; Notable for the following:    Hgb urine dipstick MODERATE (*)    Protein, ur 30 (*)    Leukocytes, UA LARGE (*)    All other components within normal limits  URINE MICROSCOPIC-ADD ON - Abnormal; Notable for the following:    Bacteria, UA FEW (*)    All other components within normal limits  BRAIN NATRIURETIC PEPTIDE - Abnormal; Notable for the following:    B Natriuretic Peptide 474.0 (*)    All other components within normal limits  MAGNESIUM - Abnormal; Notable for the following:    Magnesium 0.2 (*)    All other components within normal limits  CULTURE, BLOOD (ROUTINE X 2)  CULTURE, BLOOD (ROUTINE X 2)  URINE CULTURE  TROPONIN I  BASIC METABOLIC PANEL  CBC  I-STAT CG4 LACTIC ACID, ED  I-STAT CG4 LACTIC ACID, ED    Imaging Review Dg Chest Port 1 View  11/29/2014   CLINICAL DATA:  Weakness fatigue and fever for 3 days  EXAM: PORTABLE CHEST - 1 VIEW  COMPARISON:  07/16/2004  FINDINGS: There is hyperinflation and moderate cardiomegaly. There is generalized interstitial thickening which may represent a degree of interstitial fluid superimposed on chronic fibrotic  changes. No confluent airspace consolidation is evident. No large effusion is evident.  IMPRESSION: COPD, possibly with a component of superimposed interstitial edema from CHF.   Electronically Signed   By: Ellery Plunk M.D.   On: 11/29/2014 01:14     EKG Interpretation   Date/Time:  Wednesday Nov 30 2014 09:00:41 EDT Ventricular Rate:  87 PR Interval:  138 QRS Duration: 67 QT Interval:  358 QTC Calculation: 431 R Axis:   47 Text Interpretation:  Sinus rhythm Ventricular premature complex No  significant change was found Confirmed by Manus Gunning  MD, Brezlyn Manrique ([id number]) on  11/30/2014 9:14:11 AM  MDM   Final diagnoses:  Bacteremia  Urinary tract infection without hematuria, site unspecified   patient told to return with positive blood cultures were drawn on May 3. Patient diagnosed with UTI and has been taking Keflex. Has reports she is more confused than usual.  Lactate normal.  WBC improving.  Mild hypokalemia.   Nontoxic appearing and afebrile.  Gram negative rods in blood cultures. Will admit for IV antibiotics while sensitivities pending. D/w Dr. Ardyth HarpsHernandez.    I personally performed the services described in this documentation, which was scribed in my presence. The recorded information has been reviewed and is accurate.   Glynn OctaveStephen Katanya Schlie, MD 11/30/14 [phone]

## 2014-11-30 NOTE — ED Notes (Signed)
Pt significant other was seen yesterday and reports "someone from [facility]"called and told them that pt's medicine needed to be changed after a urine culture came back abnormal. Pt reports lower back pain denies any gi/gu symptoms.

## 2014-12-01 LAB — BASIC METABOLIC PANEL
Anion gap: 6 (ref 5–15)
BUN: 18 mg/dL (ref 6–20)
CALCIUM: 8.2 mg/dL — AB (ref 8.9–10.3)
CO2: 26 mmol/L (ref 22–32)
CREATININE: 0.74 mg/dL (ref 0.44–1.00)
Chloride: 108 mmol/L (ref 101–111)
GFR calc Af Amer: >60 mL/min (ref >60)
GFR calc non Af Amer: >60 mL/min (ref >60)
Glucose, Bld: 95 mg/dL (ref 70–99)
Potassium: 3.7 mmol/L (ref 3.5–5.1)
Sodium: 140 mmol/L (ref 135–145)

## 2014-12-01 LAB — URINE CULTURE: Colony Count: >=100000

## 2014-12-01 LAB — CBC
HCT: 30.5 % — ABNORMAL LOW (ref 36.0–46.0)
Hemoglobin: 10 g/dL — ABNORMAL LOW (ref 12.0–15.0)
MCH: 29.6 pg (ref 26.0–34.0)
MCHC: 32.8 g/dL (ref 30.0–36.0)
MCV: 90.2 fL (ref 78.0–100.0)
PLATELETS: 185 10*3/uL (ref 150–400)
RBC: 3.38 MIL/uL — ABNORMAL LOW (ref 3.87–5.11)
RDW: 14.9 % (ref 11.5–15.5)
WBC: 13.3 10*3/uL — ABNORMAL HIGH (ref 4.0–10.5)

## 2014-12-01 MED ORDER — ENOXAPARIN SODIUM 30 MG/0.3ML ~~LOC~~ SOLN
30.0000 mg | SUBCUTANEOUS | Status: DC
  Administered 2014-12-01: 30 mg via SUBCUTANEOUS
  Filled 2014-12-01: qty 0.3

## 2014-12-01 NOTE — Progress Notes (Signed)
Back gram-negative bacteremia treated with IV Rocephin no evidence of toxicity lactic acid within normal limits. No dysuria or frequency rigors or chills noted [place] B D[place] NWG:[id number] DOB: 07/30/1922 DOA: 11/30/2014 PCP: Isabella StallingNDIEGO,Koleman Marling M, MD             Physical Exam: Blood pressure 135/62, pulse 89, temperature 98.7 F (37.1 C), temperature source Oral, resp. rate 20, height 5\' 8"  (1.727 m), weight 94 lb 15.8 oz (43.087 kg), SpO2 94 %. lungs clear to A&P no rales wheeze or rhonchi heart regular rhythm no S3-S4 no heaves thrills rubs no CVA tenderness abdomen soft nontender bowel sounds normoactive   Investigations:  Recent Results (from the past 240 hour(s))  Blood Culture (routine x 2)     Status: None (Preliminary result)   Collection Time: 11/29/14  1:10 AM  Result Value Ref Range Status   Specimen Description BLOOD LEFT ANTECUBITAL  Final   Special Requests   Final    BOTTLES DRAWN AEROBIC AND ANAEROBIC AEB=10CC ANA=9CC   Culture   Final    ESCHERICHIA COLI Note: Gram Stain Report Called to,Read Back By and Verified With: FLOW MANAGER  ROBERTSON K AT 2018 ON [id number] BY [email or web address] Performed at [facility] Performed at [facility]    Report Status PENDING  Incomplete  Blood Culture (routine x 2)     Status: None (Preliminary result)   Collection Time: 11/29/14  1:35 AM  Result Value Ref Range Status   Specimen Description BLOOD RIGHT HAND  Final   Special Requests BOTTLES DRAWN AEROBIC AND ANAEROBIC 8CC EACH  Final   Culture NO GROWTH 1 DAY  Final   Report Status PENDING  Incomplete  Urine culture     Status: None   Collection Time: 11/29/14  1:37 AM  Result Value Ref Range Status   Specimen Description URINE, CATHETERIZED  Final   Special Requests NONE  Final   Colony Count   Final    >=100,000 COLONIES/ML Performed at [facility]    Culture   Final    ESCHERICHIA COLI Performed at [facility]    Report Status  12/01/2014 FINAL  Final   Organism ID, Bacteria ESCHERICHIA COLI  Final      Susceptibility   Escherichia coli - MIC*    AMPICILLIN <=2 SENSITIVE Sensitive     CEFAZOLIN <=4 SENSITIVE Sensitive     CEFTRIAXONE <=1 SENSITIVE Sensitive     CIPROFLOXACIN <=0.25 SENSITIVE Sensitive     GENTAMICIN <=1 SENSITIVE Sensitive     LEVOFLOXACIN 0.5 SENSITIVE Sensitive     NITROFURANTOIN <=16 SENSITIVE Sensitive     TOBRAMYCIN <=1 SENSITIVE Sensitive     TRIMETH/SULFA >=320 RESISTANT Resistant     PIP/TAZO <=4 SENSITIVE Sensitive     * ESCHERICHIA COLI  Blood culture (routine x 2)     Status: None (Preliminary result)   Collection Time: 11/30/14 10:00 AM  Result Value Ref Range Status   Specimen Description BLOOD RIGHT ANTECUBITAL  Final   Special Requests BOTTLES DRAWN AEROBIC AND ANAEROBIC 10CC  Final   Culture PENDING  Incomplete   Report Status PENDING  Incomplete  Blood culture (routine x 2)     Status: None (Preliminary result)   Collection Time: 11/30/14 10:08 AM  Result Value Ref Range Status   Specimen Description BLOOD RIGHT ARM  Final   Special Requests BOTTLES DRAWN AEROBIC AND ANAEROBIC 6CC  Final   Culture PENDING  Incomplete   Report Status PENDING  Incomplete     Basic Metabolic Panel:  Recent Labs  16/04/9604/04/16 1000 12/01/14 0607  NA 137 140  K 3.0* 3.7  CL 104 108  CO2 25 26  GLUCOSE 101* 95  BUN 22* 18  CREATININE 0.75 0.74  CALCIUM 8.2* 8.2*  MG 0.2*  --    Liver Function Tests:  Recent Labs  11/29/14 0120 11/30/14 1000  AST 26 20  ALT 15 14  ALKPHOS 121 110  BILITOT 0.8 0.4  PROT 6.6 6.0*  ALBUMIN 2.7* 2.4*     CBC:  Recent Labs  11/29/14 0120 11/30/14 1000 12/01/14 0607  WBC 19.3* 12.6* 13.3*  NEUTROABS 17.4* 10.7*  --   HGB 9.9* 9.6* 10.0*  HCT 29.9* 28.6* 30.5*  MCV 89.5 90.2 90.2  PLT 186 169 185    No results found.    Medications:   Impression:  Active Problems:   Bacteremia   Hypokalemia   UTI (lower urinary tract  infection)     Plan: Continue Rocephin switched to oral Levaquin when clinically appropriate monitor B med and CBC in a.m.   Consultants:    Procedures   Antibiotics: IV Rocephin                  Code Status: Full  Family Communication:  Spoke with husband and son as well as the patient  Disposition Plan see plan above  Time spent: 30 minutes   LOS: 1 day   Ira Busbin M   12/01/2014, 11:56 AM

## 2014-12-01 NOTE — Care Management Note (Addendum)
Case Management Note  Patient Details  Name: Gloria Mathis MRN: [id number] Date of Birth: 01/10/1923  Expected Discharge Date:  12/01/14               Expected Discharge Plan:  Home w Home Health Services  In-House Referral:  NA  Discharge planning Services  CM Consult  Post Acute Care Choice:  Resumption of Svcs/PTA Provider Choice offered to:     DME Arranged:    DME Agency:     HH Arranged:    HH Agency:     Status of Service:  Completed, signed off  Medicare Important Message Given:    Date Medicare IM Given:    Medicare IM give by:    Date Additional Medicare IM Given:    Additional Medicare Important Message give by:     If discussed at Long Length of Stay Meetings, dates discussed:    Additional Comments: Pt is from home, lives with husband. Pt has cane and walker for PRN use. Pt is severley demented and family has hired a [employer] aid through [employer] to assist with hygiene needs. Pt plans to DC home with resumption of aid services. [facility] made aware of admission. No order for resumption needed at DC. No further CM needs.  Malcolm Metrohildress, Nolita Kutter Demske, RN 12/01/2014, 1:25 PM

## 2014-12-01 NOTE — Progress Notes (Signed)
Initial Nutrition Assessment  DOCUMENTATION CODES: Underweight    INTERVENTION: Ensure Enlive po BID, each supplement provides 350 kcal and 20 grams of protein     NUTRITION DIAGNOSIS:   Inadequate oral intake related to   UTI as evidenced by   pt poor po intake for 5 days prior to admission.  GOAL: Pt to meet >/= 90% of their estimated nutrition needs      MONITOR: Po intake, labs and wt trends     REASON FOR ASSESSMENT:  Malnutrition Screening Tool    ASSESSMENT: Ms Gloria Mathis very pleasant lady with dementia at baseline. She presents with UTI and has not been [facility] well [facility] the [facility] son. Husband says her appetite began to pick up last night and pt ate 50% of her dinner and had eaten 75% of her breakfast this morning.  Physical exam: moderate temporal, dorsal and clavicle wasting. She meets criteria for moderate malnutrition in the context of acute illness as evidenced by moderate muscle wasting and </= 50% of energy intake for >/= 5 days. She continues to be at risk for worsening malnutrition going forward related to dementia disease progression and advanced age.  Height:  Ht Readings from Last 1 Encounters:  11/30/14 5\' 8"  (1.727 m)    Weight:  Wt Readings from Last 1 Encounters:  11/30/14 94 lb 15.8 oz (43.087 kg)    Ideal Body Weight:  63.6 kg  Wt Readings from Last 10 Encounters:  11/30/14 94 lb 15.8 oz (43.087 kg)  11/29/14 94 lb 5 oz (42.78 kg)    BMI:  Body mass index is 14.45 kg/(m^2). underweight  Estimated Nutritional Needs:  Kcal:   1290-1505  Protein:   56-65 gr  Fluid:   1.3-1.5 liters daily  Skin:    intact  Diet Order:  Diet regular Room service appropriate?: Yes; Fluid consistency:: Thin  EDUCATION NEEDS: None identified     Intake/Output Summary (Last 24 hours) at 12/01/14 1229 Last data filed at 12/01/14 0850  Gross per 24 hour  Intake  982.5 ml  Output    300 ml  Net  682.5 ml     Last BM:  11/29/14  Royann ShiversLynn Tabbitha Janvrin MS,RD,CSG,LDN Office: [phone] Pager: [phone]

## 2014-12-01 NOTE — Evaluation (Signed)
Physical Therapy Evaluation Patient Details Name: Ludger NuttingVirginia B Dejoseph MRN: [id number] DOB: 12/06/1922 Today's Date: 12/01/2014   History of Present Illness  Pt is an 81 year old female who is admitted with sepsis secondary to a UTI.  She has dementia at baseline but is normally ambulatory at home with no assistive device.  She lives with her husband  Clinical Impression  Pt is seen for evaluation.  She reports feeling well and has walked to the nurses station earlier today.  On evaluation, pt is very frail in appearance but has functional strength.  Her static and dynamic standing balance is decreased and I have therefore recommended that she use her walker for all gait.  She has a stable gait pattern with a walker.  I discussed HHPT with husband and son.   They would like to see how she does at home first.  If she has any problems they will speak with Dr. Janna Archondiego and request HHPT.  This seems very reasonable to me.    Follow Up Recommendations No PT follow up    Equipment Recommendations  None recommended by PT    Recommendations for Other Services   none    Precautions / Restrictions Precautions Precautions: Fall Restrictions Weight Bearing Restrictions: No      Mobility  Bed Mobility Overal bed mobility: Modified Independent                Transfers Overall transfer level: Modified independent                  Ambulation/Gait Ambulation/Gait assistance: Supervision Ambulation Distance (Feet): 170 Feet Assistive device: Rolling walker (2 wheeled) Gait Pattern/deviations: WFL(Within Functional Limits)   Gait velocity interpretation: at or above normal speed for age/gender General Gait Details: walker used for gait due to decreased static and dynamic balance  Stairs            Wheelchair Mobility    Modified Rankin (Stroke Patients Only)       Balance Overall balance assessment: Needs assistance Sitting-balance support: No upper extremity  supported;Feet supported Sitting balance-Leahy Scale: Good     Standing balance support: No upper extremity supported Standing balance-Leahy Scale: Fair Standing balance comment: tends to fall backward with a very narrow base of support                             Pertinent Vitals/Pain Pain Assessment: No/denies pain    Home Living Family/patient expects to be discharged to:: Private residence Living Arrangements: Spouse/significant other Available Help at Discharge: Family;Available 24 hours/day Type of Home: House Home Access: Stairs to enter Entrance Stairs-Rails: Right Entrance Stairs-Number of Steps: 2 Home Layout: One level Home Equipment: Walker - 2 wheels;Cane - single point      Prior Function Level of Independence: Independent         Comments: husband has been thinking that she needs to use her walker     Hand Dominance        Extremity/Trunk Assessment   Upper Extremity Assessment: Overall WFL for tasks assessed           Lower Extremity Assessment: Overall WFL for tasks assessed      Cervical / Trunk Assessment: Kyphotic  Communication   Communication: No difficulties  Cognition Arousal/Alertness: Awake/alert Behavior During Therapy: WFL for tasks assessed/performed Overall Cognitive Status: History of cognitive impairments - at baseline  General Comments      Exercises        Assessment/Plan    PT Assessment Patent does not need any further PT services  PT Diagnosis     PT Problem List    PT Treatment Interventions     PT Goals (Current goals can be found in the Care Plan section) Acute Rehab PT Goals PT Goal Formulation: All assessment and education complete, DC therapy    Frequency     Barriers to discharge        Co-evaluation               End of Session Equipment Utilized During Treatment: Gait belt Activity Tolerance: Patient tolerated treatment well Patient left:  in bed;with call bell/phone within reach;with family/visitor present           Time: 1191-47821509-1539 PT Time Calculation (min) (ACUTE ONLY): 30 min   Charges:   PT Evaluation $Initial PT Evaluation Tier I: 1 Procedure     PT G CodesMyrlene Broker:        Deron Poole L 12/01/2014, 3:45 PM

## 2014-12-02 LAB — CBC WITH DIFFERENTIAL/PLATELET
BASOS PCT: 0 % (ref 0–1)
Basophils Absolute: 0 10*3/uL (ref 0.0–0.1)
Eosinophils Absolute: 0.3 10*3/uL (ref 0.0–0.7)
Eosinophils Relative: 3 % (ref 0–5)
HCT: 28.2 % — ABNORMAL LOW (ref 36.0–46.0)
Hemoglobin: 9.3 g/dL — ABNORMAL LOW (ref 12.0–15.0)
Lymphocytes Relative: 8 % — ABNORMAL LOW (ref 12–46)
Lymphs Abs: 0.9 10*3/uL (ref 0.7–4.0)
MCH: 29.6 pg (ref 26.0–34.0)
MCHC: 33 g/dL (ref 30.0–36.0)
MCV: 89.8 fL (ref 78.0–100.0)
Monocytes Absolute: 1.1 10*3/uL — ABNORMAL HIGH (ref 0.1–1.0)
Monocytes Relative: 10 % (ref 3–12)
NEUTROS ABS: 8.7 10*3/uL — AB (ref 1.7–7.7)
Neutrophils Relative %: 79 % — ABNORMAL HIGH (ref 43–77)
Platelets: 182 10*3/uL (ref 150–400)
RBC: 3.14 MIL/uL — ABNORMAL LOW (ref 3.87–5.11)
RDW: 15 % (ref 11.5–15.5)
WBC: 11.1 10*3/uL — AB (ref 4.0–10.5)

## 2014-12-02 LAB — CULTURE, BLOOD (ROUTINE X 2)

## 2014-12-02 LAB — BASIC METABOLIC PANEL
ANION GAP: 5 (ref 5–15)
BUN: 15 mg/dL (ref 6–20)
CALCIUM: 7.9 mg/dL — AB (ref 8.9–10.3)
CO2: 25 mmol/L (ref 22–32)
Chloride: 110 mmol/L (ref 101–111)
Creatinine, Ser: 0.67 mg/dL (ref 0.44–1.00)
GFR calc Af Amer: >60 mL/min (ref >60)
Glucose, Bld: 96 mg/dL (ref 70–99)
Potassium: 3.6 mmol/L (ref 3.5–5.1)
Sodium: 140 mmol/L (ref 135–145)

## 2014-12-02 MED ORDER — LEVOFLOXACIN 500 MG PO TABS
500.0000 mg | ORAL_TABLET | Freq: Every day | ORAL | Status: DC
  Administered 2014-12-02: 500 mg via ORAL
  Filled 2014-12-02: qty 1

## 2014-12-02 MED ORDER — LEVOFLOXACIN 500 MG PO TABS: 500.0000 mg | ORAL_TABLET | Freq: Every day | ORAL | Status: DC

## 2014-12-02 NOTE — Progress Notes (Signed)
Pt's IV catheter removed and intact. IV site clean dry and intact. Discharge instructions and medications reviewed and discussed with patient. All questions were answered and no further questions at this time. Pt in no acute distress and in stable condition at time of discharge. Pt escorted by nurse tech.

## 2014-12-02 NOTE — Discharge Summary (Signed)
Physician Discharge Summary  Gloria NuttingVirginia B Mathis ZOX:[id number] DOB: 08/05/1922 DOA: 11/30/2014  PCP: Gloria StallingNDIEGO,Gloria Mathis  Admit date: 11/30/2014 Discharge date: 12/02/2014   Recommendations for Outpatient Follow-up:  After taking 6 days of Levaquin 500 mg by mouth daily Discharge Diagnoses:  Active Problems:   Bacteremia   Hypokalemia   UTI (lower urinary tract infection)   Discharge Condition: Good  Filed Weights   11/30/14 0830 11/30/14 1444  Weight: 95 lb (43.092 kg) 94 lb 15.8 oz (43.087 kg)    History of present illness:  She was seen in the ER with some symptoms of a UTI sent home on Keflex the Day Prior to Admission As Were Drawn However They Had a Phone Call Back Saying One of Them the Gram Stain Revealed E Coli Subsequent Summoned Back ER Admitted Placed on IV Rocephin to Cover This Possible Bacteremia the Other Blood Culture Was Negative for 5 Days She Was Recultured She Had RBCs and Numerous to Count Leukocytes in the Urine Her Lactic Acid Was within Normal Limits 2 Is No Evidence of Clinical Septicemia and She Responded to IV Rocephin for a Period of 3 Days Significant Constitutional Symptoms of Bacteremia Was Hemodynamically Stable Intercourse Was Comfortable. The Hypokalemia on Admission Was Rectified K Dur 20 Mg by Mouth Daily with Normalization of Potassium Count Is Diminished over the Course of Hospitalization on Her Renal Function Remained Normal. His Discharged on Levaquin 500 Milligrams by Mouth Daily for an Additional 6 Days  Hospital Course:  See history of present illness  Procedures:    Consultations:    Discharge Instructions  Discharge Instructions    Discharge instructions    Complete by:  As directed      Discharge patient    Complete by:  As directed             Medication List    STOP taking these medications        cephALEXin 500 MG capsule  Commonly known as:  KEFLEX      TAKE these medications        aspirin EC 81 MG tablet   Take 162 mg by mouth daily as needed for moderate pain or fever.     CALCIUM PO  Take 1 tablet by mouth daily.     levofloxacin 500 MG tablet  Commonly known as:  LEVAQUIN  Take 1 tablet (500 mg total) by mouth daily.     multivitamin with minerals Tabs tablet  Take 1 tablet by mouth daily.     potassium chloride SA 20 MEQ tablet  Commonly known as:  K-DUR,KLOR-CON  Take 1 tablet (20 mEq total) by mouth 2 (two) times daily.     vitamin E 100 UNIT capsule  Take 100 Units by mouth daily.       No Known Allergies    The results of significant diagnostics from this hospitalization (including imaging, microbiology, ancillary and laboratory) are listed below for reference.    Significant Diagnostic Studies: Dg Chest Port 1 View  11/29/2014   CLINICAL DATA:  Weakness fatigue and fever for 3 days  EXAM: PORTABLE CHEST - 1 VIEW  COMPARISON:  07/16/2004  FINDINGS: There is hyperinflation and moderate cardiomegaly. There is generalized interstitial thickening which may represent a degree of interstitial fluid superimposed on chronic fibrotic changes. No confluent airspace consolidation is evident. No large effusion is evident.  IMPRESSION: COPD, possibly with a component of superimposed interstitial edema from CHF.   Electronically Signed  By: Gloria Mathis M.D.   On: 11/29/2014 01:14    Microbiology: Recent Results (from the past 240 hour(s))  Blood CulturEllery Plunke (routine x 2)     Status: None (Preliminary result)   Collection Time: 11/29/14  1:10 AM  Result Value Ref Range Status   Specimen Description BLOOD LEFT ANTECUBITAL  Final   Special Requests   Final    BOTTLES DRAWN AEROBIC AND ANAEROBIC AEB=10CC ANA=9CC   Culture   Final    ESCHERICHIA COLI Note: Gram Stain Report Called to,Read Back By and Verified With: FLOW MANAGER  ROBERTSON K AT 2018 ON [id number] BY [email or web address] Performed at [facility] Performed at [facility]    Report Status PENDING  Incomplete  Blood  Culture (routine x 2)     Status: None (Preliminary result)   Collection Time: 11/29/14  1:35 AM  Result Value Ref Range Status   Specimen Description BLOOD RIGHT HAND  Final   Special Requests BOTTLES DRAWN AEROBIC AND ANAEROBIC 8CC EACH  Final   Culture NO GROWTH 3 DAYS  Final   Report Status PENDING  Incomplete  Urine culture     Status: None   Collection Time: 11/29/14  1:37 AM  Result Value Ref Range Status   Specimen Description URINE, CATHETERIZED  Final   Special Requests NONE  Final   Colony Count   Final    >=100,000 COLONIES/ML Performed at [facility]    Culture   Final    ESCHERICHIA COLI Performed at [facility]    Report Status 12/01/2014 FINAL  Final   Organism ID, Bacteria ESCHERICHIA COLI  Final      Susceptibility   Escherichia coli - MIC*    AMPICILLIN <=2 SENSITIVE Sensitive     CEFAZOLIN <=4 SENSITIVE Sensitive     CEFTRIAXONE <=1 SENSITIVE Sensitive     CIPROFLOXACIN <=0.25 SENSITIVE Sensitive     GENTAMICIN <=1 SENSITIVE Sensitive     LEVOFLOXACIN 0.5 SENSITIVE Sensitive     NITROFURANTOIN <=16 SENSITIVE Sensitive     TOBRAMYCIN <=1 SENSITIVE Sensitive     TRIMETH/SULFA >=320 RESISTANT Resistant     PIP/TAZO <=4 SENSITIVE Sensitive     * ESCHERICHIA COLI  Blood culture (routine x 2)     Status: None (Preliminary result)   Collection Time: 11/30/14 10:00 AM  Result Value Ref Range Status   Specimen Description BLOOD RIGHT ANTECUBITAL  Final   Special Requests BOTTLES DRAWN AEROBIC AND ANAEROBIC 10CC  Final   Culture NO GROWTH 2 DAYS  Final   Report Status PENDING  Incomplete  Blood culture (routine x 2)     Status: None (Preliminary result)   Collection Time: 11/30/14 10:08 AM  Result Value Ref Range Status   Specimen Description BLOOD RIGHT ARM  Final   Special Requests BOTTLES DRAWN AEROBIC AND ANAEROBIC 6CC  Final   Culture NO GROWTH 2 DAYS  Final   Report Status PENDING  Incomplete     Labs: Basic Metabolic  Panel:  Recent Labs Lab 11/29/14 0120 11/30/14 1000 12/01/14 0607  NA 136 137 140  K 2.8* 3.0* 3.7  CL 102 104 108  CO2 25 25 26   GLUCOSE 138* 101* 95  BUN 29* 22* 18  CREATININE 0.77 0.75 0.74  CALCIUM 8.4* 8.2* 8.2*  MG  --  0.2*  --    Liver Function Tests:  Recent Labs Lab 11/29/14 0120 11/30/14 1000  AST 26 20  ALT 15 14  ALKPHOS  121 110  BILITOT 0.8 0.4  PROT 6.6 6.0*  ALBUMIN 2.7* 2.4*   No results for input(s): LIPASE, AMYLASE in the last 168 hours. No results for input(s): AMMONIA in the last 168 hours. CBC:  Recent Labs Lab 11/29/14 0120 11/30/14 1000 12/01/14 0607  WBC 19.3* 12.6* 13.3*  NEUTROABS 17.4* 10.7*  --   HGB 9.9* 9.6* 10.0*  HCT 29.9* 28.6* 30.5*  MCV 89.5 90.2 90.2  PLT 186 169 185   Cardiac Enzymes:  Recent Labs Lab 11/30/14 1000  TROPONINI <0.03   BNP: BNP (last 3 results)  Recent Labs  11/30/14 1000  BNP 474.0*    ProBNP (last 3 results) No results for input(s): PROBNP in the last 8760 hours.  CBG: No results for input(s): GLUCAP in the last 168 hours.     Signed:  Georgean Spainhower Judie Petit  [facility] Pager: [phone] 12/02/2014, 6:34 AM

## 2014-12-03 ENCOUNTER — Telehealth: Payer: Self-pay | Admitting: Emergency Medicine

## 2014-12-03 NOTE — Telephone Encounter (Addendum)
Post ED Visit - Positive Culture Follow-up  Culture report reviewed by antimicrobial stewardship pharmacist: []  Wes Dulaney, Pharm.D., BCPS []  Celedonio MiyamotoJeremy Frens, Pharm.D., BCPS []  Georgina PillionElizabeth Martin, Pharm.D., BCPS []  SenecaMinh Pham, VermontPharm.D., BCPS, AAHIVP []  Estella HuskMichelle Turner, Pharm.D., BCPS, AAHIVP [x]  Christoper Fabianaron Amend, [address]Pharm.D., BCPS  Positive Bllood culture Treated with Cephalexin, organism sensitive to the same and no further patient follow-up is required at this time.  Jiles HaroldGammons, [place] Bahri Chaney 12/03/2014, 1:59 PM

## 2014-12-04 LAB — CULTURE, BLOOD (ROUTINE X 2): Culture: NO GROWTH

## 2014-12-05 LAB — CULTURE, BLOOD (ROUTINE X 2)
CULTURE: NO GROWTH
Culture: NO GROWTH

## 2016-04-24 ENCOUNTER — Other Ambulatory Visit: Payer: Self-pay

## 2016-04-24 DIAGNOSIS — I639 Cerebral infarction, unspecified: Secondary | ICD-10-CM

## 2016-05-02 DIAGNOSIS — Z23 Encounter for immunization: Secondary | ICD-10-CM | POA: Diagnosis not present

## 2016-05-31 ENCOUNTER — Emergency Department (HOSPITAL_COMMUNITY): Payer: PPO

## 2016-05-31 ENCOUNTER — Emergency Department (HOSPITAL_COMMUNITY)
Admission: EM | Admit: 2016-05-31 | Discharge: 2016-05-31 | Disposition: A | Discharge status: Home / Self Care | Payer: PPO | Attending: Emergency Medicine | Admitting: Emergency Medicine

## 2016-05-31 ENCOUNTER — Encounter (HOSPITAL_COMMUNITY): Payer: Self-pay | Admitting: Emergency Medicine

## 2016-05-31 DIAGNOSIS — W19XXXA Unspecified fall, initial encounter: Secondary | ICD-10-CM

## 2016-05-31 DIAGNOSIS — Z7982 Long term (current) use of aspirin: Secondary | ICD-10-CM | POA: Diagnosis not present

## 2016-05-31 DIAGNOSIS — M25551 Pain in right hip: Secondary | ICD-10-CM | POA: Diagnosis not present

## 2016-05-31 DIAGNOSIS — Y929 Unspecified place or not applicable: Secondary | ICD-10-CM | POA: Diagnosis not present

## 2016-05-31 DIAGNOSIS — S79911A Unspecified injury of right hip, initial encounter: Secondary | ICD-10-CM | POA: Diagnosis not present

## 2016-05-31 DIAGNOSIS — Y939 Activity, unspecified: Secondary | ICD-10-CM | POA: Insufficient documentation

## 2016-05-31 DIAGNOSIS — W010XXA Fall on same level from slipping, tripping and stumbling without subsequent striking against object, initial encounter: Secondary | ICD-10-CM | POA: Diagnosis not present

## 2016-05-31 DIAGNOSIS — Z79899 Other long term (current) drug therapy: Secondary | ICD-10-CM | POA: Insufficient documentation

## 2016-05-31 DIAGNOSIS — Y999 Unspecified external cause status: Secondary | ICD-10-CM | POA: Insufficient documentation

## 2016-05-31 MED ORDER — IBUPROFEN 600 MG PO TABS: ORAL_TABLET | ORAL | 0 refills | Status: DC

## 2016-05-31 NOTE — ED Triage Notes (Signed)
Patient fell today on steps outside approx. 1-2 hours ago. Complains of right hip pain. States wife is able to walk. Denies neck or back pain.

## 2016-05-31 NOTE — Discharge Instructions (Signed)
Use a walker to ambulate with. Follow-up with your doctor next week for recheck

## 2016-05-31 NOTE — ED Provider Notes (Signed)
[facility]-EMERGENCY DEPT Provider Note   CSN: [id number] Arrival date & time: 05/31/16  1515     History   Chief Complaint Chief Complaint  Patient presents with  . Fall    HPI [name] is a 82 y.o. female.  Patient states that she slipped and fell on her right hip. Patient having mild to moderate discomfort in the right hip. Patient and family member states that she did not hit her head   The history is provided by the patient. No language interpreter was used.  Fall  This is a new problem. The current episode started 3 to 5 hours ago. The problem occurs rarely. The problem has been resolved. Pertinent negatives include no chest pain, no abdominal pain and no headaches. Exacerbated by: Palpation and ambulating. She has tried nothing for the symptoms.    Past Medical History:  Diagnosis Date  . Dementia   . Hyperlipidemia   . Osteopenia     Patient Active Problem List   Diagnosis Date Noted  . Bacteremia 11/30/2014  . Hypokalemia 11/30/2014  . UTI (lower urinary tract infection) 11/30/2014  . Dementia     History reviewed. No pertinent surgical history.  OB History    No data available       Home Medications    Prior to Admission medications   Medication Sig Start Date End Date Taking? Authorizing Provider  aspirin EC 81 MG tablet Take 162 mg by mouth daily as needed for moderate pain or fever.   Yes Historical Provider, MD  CALCIUM PO Take 1 tablet by mouth daily.   Yes Historical Provider, MD  Multiple Vitamin (MULTIVITAMIN WITH MINERALS) TABS tablet Take 1 tablet by mouth daily.   Yes Historical Provider, MD  potassium chloride SA (K-DUR,KLOR-CON) 20 MEQ tablet Take 1 tablet (20 mEq total) by mouth 2 (two) times daily. 11/29/14  Yes Devoria AlbeIva Knapp, MD  ibuprofen (ADVIL,MOTRIN) 600 MG tablet Take one every 8 hours for pain 05/31/16   Bethann BerkshireJoseph Monchel Pollitt, MD    Family History No family history on file.  Social History Social History  Substance Use Topics  .  Smoking status: Never Smoker  . Smokeless tobacco: Not on file  . Alcohol use No     Allergies   Review of patient's allergies indicates no known allergies.   Review of Systems Review of Systems  Constitutional: Negative for appetite change and fatigue.  HENT: Negative for congestion, ear discharge and sinus pressure.   Eyes: Negative for discharge.  Respiratory: Negative for cough.   Cardiovascular: Negative for chest pain.  Gastrointestinal: Negative for abdominal pain and diarrhea.  Genitourinary: Negative for frequency and hematuria.  Musculoskeletal: Negative for back pain.       Right hip pain  Skin: Negative for rash.  Neurological: Negative for seizures and headaches.  Psychiatric/Behavioral: Negative for hallucinations.     Physical Exam Updated Vital Signs BP 174/69 (BP Location: Left Arm)   Pulse 86   Temp 98.6 F (37 C) (Oral)   Resp 15   Ht 5\' 1"  (1.549 m)   Wt 94 lb (42.6 kg)   SpO2 100%   BMI 17.76 kg/m   Physical Exam  Constitutional: She is oriented to person, place, and time. She appears well-developed.  HENT:  Head: Normocephalic.  Eyes: Conjunctivae and EOM are normal. No scleral icterus.  Neck: Neck supple. No thyromegaly present.  Cardiovascular: Normal rate and regular rhythm.  Exam reveals no gallop and no friction rub.  No murmur heard. Pulmonary/Chest: No stridor. She has no wheezes. She has no rales. She exhibits no tenderness.  Abdominal: She exhibits no distension. There is no tenderness. There is no rebound.  Musculoskeletal: Normal range of motion. She exhibits no edema.  Mild right hip tender  Lymphadenopathy:    She has no cervical adenopathy.  Neurological: She is oriented to person, place, and time. She exhibits normal muscle tone. Coordination normal.  Skin: No rash noted. No erythema.  Psychiatric: She has a normal mood and affect. Her behavior is normal.     ED Treatments / Results  Labs (all labs ordered are listed,  but only abnormal results are displayed) Labs Reviewed - No data to display  EKG  EKG Interpretation None       Radiology Dg Hip Unilat  With Pelvis 2-3 Views Right  Result Date: 05/31/2016 CLINICAL DATA:  Fall.   Pain EXAM: DG HIP (WITH OR WITHOUT PELVIS) 2-3V RIGHT COMPARISON:  None. FINDINGS: There is no evidence of hip fracture or dislocation. There is no evidence of arthropathy or other focal bone abnormality. IMPRESSION: Negative. Electronically Signed   By: Marlan Palauharles  Clark M.D.   On: 05/31/2016 15:57    Procedures Procedures (including critical care time)  Medications Ordered in ED Medications - No data to display   Initial Impression / Assessment and Plan / ED Course  I have reviewed the triage vital signs and the nursing notes.  Pertinent labs & imaging results that were available during my care of the patient were reviewed by me and considered in my medical decision making (see chart for details).  Clinical Course      Final Clinical Impressions(s) / ED Diagnoses   Final diagnoses:  Fall, initial encounter    New Prescriptions New Prescriptions   IBUPROFEN (ADVIL,MOTRIN) 600 MG TABLET    Take one every 8 hours for pain     Bethann BerkshireJoseph Jerrie Gullo, MD 05/31/16 1657

## 2016-10-01 ENCOUNTER — Emergency Department (HOSPITAL_COMMUNITY): Payer: PPO

## 2016-10-01 ENCOUNTER — Inpatient Hospital Stay (HOSPITAL_COMMUNITY)
Admission: EM | Admit: 2016-10-01 | Discharge: 2016-10-04 | DRG: 690 | Disposition: A | Discharge status: Home / Self Care | Payer: PPO | Attending: Family Medicine | Admitting: Family Medicine

## 2016-10-01 ENCOUNTER — Encounter (HOSPITAL_COMMUNITY): Payer: Self-pay | Admitting: *Deleted

## 2016-10-01 DIAGNOSIS — F0390 Unspecified dementia without behavioral disturbance: Secondary | ICD-10-CM | POA: Diagnosis not present

## 2016-10-01 DIAGNOSIS — Z79899 Other long term (current) drug therapy: Secondary | ICD-10-CM

## 2016-10-01 DIAGNOSIS — E869 Volume depletion, unspecified: Secondary | ICD-10-CM

## 2016-10-01 DIAGNOSIS — Z7982 Long term (current) use of aspirin: Secondary | ICD-10-CM

## 2016-10-01 DIAGNOSIS — Y92009 Unspecified place in unspecified non-institutional (private) residence as the place of occurrence of the external cause: Secondary | ICD-10-CM | POA: Diagnosis not present

## 2016-10-01 DIAGNOSIS — M8580 Other specified disorders of bone density and structure, unspecified site: Secondary | ICD-10-CM | POA: Diagnosis not present

## 2016-10-01 DIAGNOSIS — W19XXXA Unspecified fall, initial encounter: Secondary | ICD-10-CM

## 2016-10-01 DIAGNOSIS — S3992XA Unspecified injury of lower back, initial encounter: Secondary | ICD-10-CM | POA: Diagnosis not present

## 2016-10-01 DIAGNOSIS — R000 Tachycardia, unspecified: Secondary | ICD-10-CM

## 2016-10-01 DIAGNOSIS — E860 Dehydration: Secondary | ICD-10-CM | POA: Diagnosis present

## 2016-10-01 DIAGNOSIS — N390 Urinary tract infection, site not specified: Principal | ICD-10-CM

## 2016-10-01 DIAGNOSIS — R509 Fever, unspecified: Secondary | ICD-10-CM

## 2016-10-01 DIAGNOSIS — F0151 Vascular dementia with behavioral disturbance: Secondary | ICD-10-CM

## 2016-10-01 DIAGNOSIS — R05 Cough: Secondary | ICD-10-CM | POA: Diagnosis not present

## 2016-10-01 DIAGNOSIS — S79911A Unspecified injury of right hip, initial encounter: Secondary | ICD-10-CM | POA: Diagnosis not present

## 2016-10-01 DIAGNOSIS — E876 Hypokalemia: Secondary | ICD-10-CM

## 2016-10-01 DIAGNOSIS — S79912A Unspecified injury of left hip, initial encounter: Secondary | ICD-10-CM | POA: Diagnosis not present

## 2016-10-01 DIAGNOSIS — F01518 Vascular dementia, unspecified severity, with other behavioral disturbance: Secondary | ICD-10-CM

## 2016-10-01 LAB — URINALYSIS, ROUTINE W REFLEX MICROSCOPIC
Bacteria, UA: NONE SEEN
Bilirubin Urine: NEGATIVE
GLUCOSE, UA: NEGATIVE mg/dL
Ketones, ur: 5 mg/dL — AB
Nitrite: NEGATIVE
Protein, ur: 100 mg/dL — AB
SPECIFIC GRAVITY, URINE: 1.015 (ref 1.005–1.030)
pH: 5 (ref 5.0–8.0)

## 2016-10-01 LAB — BASIC METABOLIC PANEL
ANION GAP: 9 (ref 5–15)
BUN: 19 mg/dL (ref 6–20)
CO2: 27 mmol/L (ref 22–32)
Calcium: 8.8 mg/dL — ABNORMAL LOW (ref 8.9–10.3)
Chloride: 99 mmol/L — ABNORMAL LOW (ref 101–111)
Creatinine, Ser: 0.85 mg/dL (ref 0.44–1.00)
GFR, EST NON AFRICAN AMERICAN: 58 mL/min — AB (ref >60)
Glucose, Bld: 106 mg/dL — ABNORMAL HIGH (ref 65–99)
POTASSIUM: 3.5 mmol/L (ref 3.5–5.1)
SODIUM: 135 mmol/L (ref 135–145)

## 2016-10-01 LAB — CBC WITH DIFFERENTIAL/PLATELET
BASOS PCT: 0 %
Basophils Absolute: 0 10*3/uL (ref 0.0–0.1)
EOS ABS: 0 10*3/uL (ref 0.0–0.7)
EOS PCT: 0 %
HCT: 40.7 % (ref 36.0–46.0)
Hemoglobin: 13.4 g/dL (ref 12.0–15.0)
LYMPHS PCT: 10 %
Lymphs Abs: 0.8 10*3/uL (ref 0.7–4.0)
MCH: 30 pg (ref 26.0–34.0)
MCHC: 32.9 g/dL (ref 30.0–36.0)
MCV: 91.3 fL (ref 78.0–100.0)
Monocytes Absolute: 0.6 10*3/uL (ref 0.1–1.0)
Monocytes Relative: 8 %
Neutro Abs: 6.1 10*3/uL (ref 1.7–7.7)
Neutrophils Relative %: 82 %
PLATELETS: 217 10*3/uL (ref 150–400)
RBC: 4.46 MIL/uL (ref 3.87–5.11)
RDW: 13.9 % (ref 11.5–15.5)
WBC: 7.5 10*3/uL (ref 4.0–10.5)

## 2016-10-01 LAB — INFLUENZA PANEL BY PCR (TYPE A & B)
INFLAPCR: NEGATIVE
INFLBPCR: NEGATIVE

## 2016-10-01 LAB — TROPONIN I

## 2016-10-01 LAB — LACTIC ACID, PLASMA: Lactic Acid, Venous: 1.4 mmol/L (ref 0.5–1.9)

## 2016-10-01 MED ORDER — ACETAMINOPHEN 325 MG PO TABS
650.0000 mg | ORAL_TABLET | Freq: Once | ORAL | Status: AC
  Administered 2016-10-01: 650 mg via ORAL
  Filled 2016-10-01: qty 2

## 2016-10-01 MED ORDER — SODIUM CHLORIDE 0.9 % IV BOLUS (SEPSIS)
250.0000 mL | Freq: Once | INTRAVENOUS | Status: AC
  Administered 2016-10-02: 250 mL via INTRAVENOUS

## 2016-10-01 MED ORDER — SODIUM CHLORIDE 0.9 % IV SOLN: INTRAVENOUS | Status: DC

## 2016-10-01 MED ORDER — ACETAMINOPHEN 325 MG PO TABS
ORAL_TABLET | ORAL | Status: AC
  Filled 2016-10-01: qty 1

## 2016-10-01 MED ORDER — CEFTRIAXONE SODIUM 1 G IJ SOLR
1.0000 g | Freq: Once | INTRAMUSCULAR | Status: AC
  Administered 2016-10-02: 1 g via INTRAVENOUS
  Filled 2016-10-01: qty 10

## 2016-10-01 NOTE — ED Notes (Signed)
Pt is incontinent of urine at this time, strong odor noted. Blue scrub pants and diaper provided.

## 2016-10-01 NOTE — ED Notes (Signed)
Pt ambulated to the bathroom to void.  No difficulty ambulating

## 2016-10-01 NOTE — ED Notes (Signed)
Pt was taken to xray

## 2016-10-01 NOTE — ED Triage Notes (Addendum)
Pt comes in with husband. He states she fell this morning. He states, "she is wobbly on her feet." Pt has hx of dementia. She is alert in triage. Fever 100.6 in triage. Pt denies any pain. Pts husband state she has been coughing for 3 days.   Pt has crackles noted to right lung.

## 2016-10-02 ENCOUNTER — Encounter (HOSPITAL_COMMUNITY): Payer: Self-pay | Admitting: *Deleted

## 2016-10-02 DIAGNOSIS — W19XXXA Unspecified fall, initial encounter: Secondary | ICD-10-CM

## 2016-10-02 DIAGNOSIS — M8580 Other specified disorders of bone density and structure, unspecified site: Secondary | ICD-10-CM | POA: Diagnosis not present

## 2016-10-02 DIAGNOSIS — N390 Urinary tract infection, site not specified: Secondary | ICD-10-CM | POA: Diagnosis present

## 2016-10-02 DIAGNOSIS — E869 Volume depletion, unspecified: Secondary | ICD-10-CM | POA: Diagnosis not present

## 2016-10-02 DIAGNOSIS — F0390 Unspecified dementia without behavioral disturbance: Secondary | ICD-10-CM

## 2016-10-02 DIAGNOSIS — Z79899 Other long term (current) drug therapy: Secondary | ICD-10-CM | POA: Diagnosis not present

## 2016-10-02 DIAGNOSIS — E860 Dehydration: Secondary | ICD-10-CM | POA: Diagnosis not present

## 2016-10-02 DIAGNOSIS — Y92009 Unspecified place in unspecified non-institutional (private) residence as the place of occurrence of the external cause: Secondary | ICD-10-CM | POA: Diagnosis not present

## 2016-10-02 DIAGNOSIS — R000 Tachycardia, unspecified: Secondary | ICD-10-CM | POA: Diagnosis not present

## 2016-10-02 DIAGNOSIS — Z7982 Long term (current) use of aspirin: Secondary | ICD-10-CM | POA: Diagnosis not present

## 2016-10-02 LAB — GLUCOSE, CAPILLARY
GLUCOSE-CAPILLARY: 73 mg/dL (ref 65–99)
Glucose-Capillary: 81 mg/dL (ref 65–99)

## 2016-10-02 MED ORDER — ENOXAPARIN SODIUM 40 MG/0.4ML ~~LOC~~ SOLN: 40.0000 mg | SUBCUTANEOUS | Status: DC

## 2016-10-02 MED ORDER — ACETAMINOPHEN 325 MG PO TABS
650.0000 mg | ORAL_TABLET | Freq: Four times a day (QID) | ORAL | Status: DC | PRN
  Administered 2016-10-02 – 2016-10-04 (×3): 650 mg via ORAL
  Filled 2016-10-02 (×3): qty 2

## 2016-10-02 MED ORDER — ONDANSETRON HCL 4 MG/2ML IJ SOLN: 4.0000 mg | Freq: Four times a day (QID) | INTRAMUSCULAR | Status: DC | PRN

## 2016-10-02 MED ORDER — ASPIRIN EC 81 MG PO TBEC: 162.0000 mg | DELAYED_RELEASE_TABLET | Freq: Every day | ORAL | Status: DC | PRN

## 2016-10-02 MED ORDER — ADULT MULTIVITAMIN W/MINERALS CH
1.0000 | ORAL_TABLET | Freq: Every day | ORAL | Status: DC
  Administered 2016-10-02 – 2016-10-03 (×2): 1 via ORAL
  Filled 2016-10-02 (×2): qty 1

## 2016-10-02 MED ORDER — ACETAMINOPHEN 650 MG RE SUPP: 650.0000 mg | Freq: Four times a day (QID) | RECTAL | Status: DC | PRN

## 2016-10-02 MED ORDER — ENOXAPARIN SODIUM 30 MG/0.3ML ~~LOC~~ SOLN
30.0000 mg | SUBCUTANEOUS | Status: DC
  Administered 2016-10-02 – 2016-10-03 (×2): 30 mg via SUBCUTANEOUS
  Filled 2016-10-02 (×3): qty 0.3

## 2016-10-02 MED ORDER — BISACODYL 10 MG RE SUPP: 10.0000 mg | Freq: Every day | RECTAL | Status: DC | PRN

## 2016-10-02 MED ORDER — VITAMIN E 180 MG (400 UNIT) PO CAPS
400.0000 [IU] | ORAL_CAPSULE | Freq: Every day | ORAL | Status: DC
  Administered 2016-10-02 – 2016-10-03 (×2): 400 [IU] via ORAL
  Filled 2016-10-02 (×6): qty 1

## 2016-10-02 MED ORDER — SODIUM CHLORIDE 0.9 % IV SOLN
INTRAVENOUS | Status: DC
Start: 2016-10-02 — End: 2016-10-04
  Administered 2016-10-02 – 2016-10-04 (×3): via INTRAVENOUS

## 2016-10-02 MED ORDER — ONDANSETRON HCL 4 MG PO TABS: 4.0000 mg | ORAL_TABLET | Freq: Four times a day (QID) | ORAL | Status: DC | PRN

## 2016-10-02 MED ORDER — CEFTRIAXONE SODIUM 1 G IJ SOLR
1.0000 g | INTRAMUSCULAR | Status: DC
  Administered 2016-10-03 – 2016-10-04 (×2): 1 g via INTRAVENOUS
  Filled 2016-10-02 (×5): qty 10

## 2016-10-02 NOTE — H&P (Signed)
[facility] History and Physical  Gloria NuttingVirginia B Mathis RUE:[id number] DOB: 08/31/1922 DOA: 10/01/2016  Referring physician: Dr. Clarene DukeMcManus PCP: Gloria StallingNDIEGO,Gloria M, MD   Chief Complaint: Fall at home, "wobbly " on her feet per husband  HPI: Gloria Mathis is a 83 y.o. female with history of HL, dementia who presents to ED today after a fall onto her hip earlier today.  Husband gives the history.  They both have been "sick" and not eating and they are "dehydrated".  He has had a cold and she had a fever.  No n/v no abd pain for her.  Denies dysuria, but also appears not to be a good historian and doesn't respond to most questions.  Xrays in ED were negative for fracture (LS spine and bilat hips).  CXR clear and EKG/ trop normal.  UA dirty , prob UTI.  Asked to see for admission.    ROS  denies CP  no joint pain   no HA  no blurry vision  no rash  no diarrhea  no nausea/ vomiting  no dysuria  no difficulty voiding  no change in urine color    Past Medical History  Past Medical History:  Diagnosis Date  . Dementia   . Hyperlipidemia   . Osteopenia    Past Surgical History History reviewed. No pertinent surgical history. Family History No family history on file. Social History  reports that she has never smoked. She has never used smokeless tobacco. She reports that she does not drink alcohol or use drugs. Allergies No Known Allergies Home medications Prior to Admission medications   Medication Sig Start Date End Date Taking? Authorizing Provider  CALCIUM PO Take 1 tablet by mouth daily.   Yes Historical Provider, MD  Multiple Vitamin (MULTIVITAMIN WITH MINERALS) TABS tablet Take 1 tablet by mouth daily.   Yes Historical Provider, MD  vitamin E 400 UNIT capsule Take 400 Units by mouth daily.   Yes Historical Provider, MD  aspirin EC 81 MG tablet Take 162 mg by mouth daily as needed for moderate pain or fever.    Historical Provider, MD   Liver Function Tests No results for  input(s): AST, ALT, ALKPHOS, BILITOT, PROT, ALBUMIN in the last 168 hours. No results for input(s): LIPASE, AMYLASE in the last 168 hours. CBC  Recent Labs Lab 10/01/16 1921  WBC 7.5  NEUTROABS 6.1  HGB 13.4  HCT 40.7  MCV 91.3  PLT 217   Basic Metabolic Panel  Recent Labs Lab 10/01/16 1921  NA 135  K 3.5  CL 99*  CO2 27  GLUCOSE 106*  BUN 19  CREATININE 0.85  CALCIUM 8.8*     Vitals:   10/01/16 1819 10/01/16 1931 10/01/16 2030 10/01/16 2252  BP: 161/76  118/61 117/75  Pulse: 82  92 89  Resp: 18  20 18   Temp: 100.6 F (38.1 C) 98.8 F (37.1 C) 97.7 F (36.5 C)   TempSrc: Oral Oral    SpO2: 100%  99% 98%  Weight: 40.8 kg (90 lb)     Height: 5\' 6"  (1.676 m)      Exam: Gen frail, thin , elderly female, pleasantly demented, sitting up in WC No rash, cyanosis or gangrene Sclera anicteric, throat clear and dry No jvd or bruits Chest basilar crackles clear with deep breaths, no wheezing RRR no MRG Abd soft ntnd no mass or ascites +bs GU defer MS no joint effusions or deformity Ext no LE edema / no LE wounds or ulcers  Neuro is alert, Ox 1, nonfocal, gen'd weakness   Na 135  K 3.5 CO2 27  BUN 19  Cr 0.85   Ca 8.8   Trop < 0.03 WBC 7k  Hb 13   plt 217  Flu A/ B neg by PCR UA turbid, TNTC wbc's/ rbc's  EKG (independ reviewed) > NSR 60 bpm, no acute changes CXR (independ reviewed) > hyperinflation c/w COPD, no infiltrates Bilat hip xray - negative for fracture/ dislocation LS spine xray - negative for acute lumbar spine fracture.   Assessment: 1. Fall - negative for fractures 2. Pyuria - suspect UTI 3. Dehydration - +orthostatics 4. Dementia - sig issue   Plan - admit, IVF's and IV Rocephin, f/u urine cx     Marlan Steward D [facility] Pager [phone]   If 7PM-7AM, please contact night-coverage [email or web address] Password [email or web address] 10/02/2016, 12:05 AM

## 2016-10-02 NOTE — ED Provider Notes (Signed)
[facility]-EM[facility]ENCY DEPT Provider Note   CSN: [id number] Arrival date & time: 10/01/16  1804     History   Chief Complaint Chief Complaint  Patient presents with  . Fall  . Fatigue  . Cough    HPI Gloria Mathis is a 83 y.o. female.  The history is provided by the spouse and the patient. The history is limited by the condition of the patient (Hx dementia).  Fall   Cough   Pt was seen at 2120. Per pt and her husband: Pt with gradual onset and worsening of persistent generalized weakness for the past 3 days. Pt's husband states pt has been "wobbly on her feet." Has been associated with subjective home fevers/chills, cough, poor PO intake. Pt fell today onto her buttocks. Pt has been ambulatory since the fall. Denies hitting head, no LOC, no AMS from baseline, no vomiting/diarrhea. Pt herself denies CP/SOB, no abd pain.   Past Medical History:  Diagnosis Date  . Dementia   . Hyperlipidemia   . Osteopenia     Patient Active Problem List   Diagnosis Date Noted  . UTI (urinary tract infection) 10/02/2016  . Fall 10/02/2016  . Volume depletion 10/02/2016  . Hypokalemia 11/30/2014  . Dementia     History reviewed. No pertinent surgical history.  OB History    No data available       Home Medications    Prior to Admission medications   Medication Sig Start Date End Date Taking? Authorizing Provider  CALCIUM PO Take 1 tablet by mouth daily.   Yes Historical Provider, MD  Multiple Vitamin (MULTIVITAMIN WITH MINERALS) TABS tablet Take 1 tablet by mouth daily.   Yes Historical Provider, MD  vitamin E 400 UNIT capsule Take 400 Units by mouth daily.   Yes Historical Provider, MD  aspirin EC 81 MG tablet Take 162 mg by mouth daily as needed for moderate pain or fever.    Historical Provider, MD    Family History No family history on file.  Social History Social History  Substance Use Topics  . Smoking status: Never Smoker  . Smokeless tobacco: Never Used  . Alcohol  use No     Allergies   Patient has no known allergies.   Review of Systems Review of Systems  Unable to perform ROS: Dementia  Respiratory: Positive for cough.      Physical Exam Updated Vital Signs BP 117/75 (BP Location: Right Arm)   Pulse 89   Temp 97.7 F (36.5 C)   Resp 18   Ht 5\' 6"  (1.676 m)   Wt 90 lb (40.8 kg)   SpO2 98%   BMI 14.53 kg/m    21:47:33 Orthostatic Vital Signs EF  Orthostatic Lying   BP- Lying: 126/50  Pulse- Lying: 88 (irregular-76-88)      Orthostatic Sitting  BP- Sitting: 130/78  Pulse- Sitting: 113 (pt has an episode of tachycardia into the 140's)      Orthostatic Standing at 0 minutes  BP- Standing at 0 minutes: 123/61  Pulse- Standing at 0 minutes: 108    Patient Vitals for the past 24 hrs:  BP Temp Temp src Pulse Resp SpO2 Height Weight  10/01/16 2252 117/75 - - 89 18 98 % - -  10/01/16 2030 118/61 97.7 F (36.5 C) - 92 20 99 % - -  10/01/16 1931 - 98.8 F (37.1 C) Oral - - - - -  10/01/16 1819 161/76 100.6 F (38.1 C) Oral 82 18 100 %  5\' 6"  (1.676 m) 90 lb (40.8 kg)     Physical Exam 2125: Physical examination:  Nursing notes reviewed; Vital signs and O2 SAT reviewed;  Constitutional: Thin, frail. In no acute distress; Head:  Normocephalic, atraumatic; Eyes: EOMI, PERRL, No scleral icterus; ENMT: Mouth and pharynx normal, Mucous membranes dry; Neck: Supple, Full range of motion, No lymphadenopathy; Cardiovascular: Regular rate and rhythm, No gallop; Respiratory: Breath sounds clear & equal bilaterally, No wheezes.  Speaking full sentences with ease, Normal respiratory effort/excursion; Chest: Nontender, Movement normal; Abdomen: Soft, Nontender, Nondistended, Normal bowel sounds; Genitourinary: No CVA tenderness; Spine:  No midline CS, TS, LS tenderness.;; Extremities: Pulses normal, Pelvis stable. No deformity. No tenderness, No edema, No calf edema or asymmetry.; Neuro: Awake, alert, confused per hx dementia. No facial droop.  Grips equal. Strength 5/5 equal bilat UE's and LE's. Moves all extremities spontaneously and to command without apparent gross focal motor deficits.; Skin: Color normal, Warm, Dry.   ED Treatments / Results  Labs (all labs ordered are listed, but only abnormal results are displayed)   EKG  EKG Interpretation  Date/Time:  Tuesday October 01 2016 21:45:18 EST Ventricular Rate:  73 PR Interval:    QRS Duration: 77 QT Interval:  399 QTC Calculation: 440 R Axis:   41 Text Interpretation:  Sinus rhythm Atrial premature complexes in couplets Baseline wander When compared with ECG of 11/30/2014 No significant change was found Confirmed by [facility]  MD, Nicholos JohnsKATHLEEN [phone]([id number]) on 10/01/2016 10:55:02 PM       Radiology   Procedures Procedures (including critical care time)  Medications Ordered in ED Medications  sodium chloride 0.9 % bolus 250 mL (not administered)  cefTRIAXone (ROCEPHIN) 1 g in dextrose 5 % 50 mL IVPB (not administered)  acetaminophen (TYLENOL) tablet 650 mg (650 mg Oral Given 10/01/16 1830)     Initial Impression / Assessment and Plan / ED Course  I have reviewed the triage vital signs and the nursing notes.  Pertinent labs & imaging results that were available during my care of the patient were reviewed by me and considered in my medical decision making (see chart for details).  MDM Reviewed: previous chart, nursing note and vitals Reviewed previous: labs and ECG Interpretation: labs, ECG and x-ray    Results for orders placed or performed during the hospital encounter of 10/01/16  CBC with Differential  Result Value Ref Range   WBC 7.5 4.0 - 10.5 K/uL   RBC 4.46 3.87 - 5.11 MIL/uL   Hemoglobin 13.4 12.0 - 15.0 g/dL   HCT 30.840.7 65.736.0 - 84.646.0 %   MCV 91.3 78.0 - 100.0 fL   MCH 30.0 26.0 - 34.0 pg   MCHC 32.9 30.0 - 36.0 g/dL   RDW 96.213.9 95.211.5 - 84.115.5 %   Platelets 217 150 - 400 K/uL   Neutrophils Relative % 82 %   Neutro Abs 6.1 1.7 - 7.7 K/uL   Lymphocytes Relative  10 %   Lymphs Abs 0.8 0.7 - 4.0 K/uL   Monocytes Relative 8 %   Monocytes Absolute 0.6 0.1 - 1.0 K/uL   Eosinophils Relative 0 %   Eosinophils Absolute 0.0 0.0 - 0.7 K/uL   Basophils Relative 0 %   Basophils Absolute 0.0 0.0 - 0.1 K/uL  Basic metabolic panel  Result Value Ref Range   Sodium 135 135 - 145 mmol/L   Potassium 3.5 3.5 - 5.1 mmol/L   Chloride 99 (L) 101 - 111 mmol/L   CO2 27 22 - 32  mmol/L   Glucose, Bld 106 (H) 65 - 99 mg/dL   BUN 19 6 - 20 mg/dL   Creatinine, Ser 1.61 0.44 - 1.00 mg/dL   Calcium 8.8 (L) 8.9 - 10.3 mg/dL   GFR calc non Af Amer 58 (L) >60 mL/min   GFR calc Af Amer >60 >60 mL/min   Anion gap 9 5 - 15  Urinalysis, Routine w reflex microscopic  Result Value Ref Range   Color, Urine YELLOW YELLOW   APPearance TURBID (A) CLEAR   Specific Gravity, Urine 1.015 1.005 - 1.030   pH 5.0 5.0 - 8.0   Glucose, UA NEGATIVE NEGATIVE mg/dL   Hgb urine dipstick MODERATE (A) NEGATIVE   Bilirubin Urine NEGATIVE NEGATIVE   Ketones, ur 5 (A) NEGATIVE mg/dL   Protein, ur 096 (A) NEGATIVE mg/dL   Nitrite NEGATIVE NEGATIVE   Leukocytes, UA MODERATE (A) NEGATIVE   RBC / HPF TOO NUMEROUS TO COUNT 0 - 5 RBC/hpf   WBC, UA TOO NUMEROUS TO COUNT 0 - 5 WBC/hpf   Bacteria, UA NONE SEEN NONE SEEN   WBC Clumps PRESENT    Budding Yeast PRESENT    Hyaline Casts, UA PRESENT    Non Squamous Epithelial 0-5 (A) NONE SEEN  Troponin I  Result Value Ref Range   Troponin I <0.03 <0.03 ng/mL  Lactic acid, plasma  Result Value Ref Range   Lactic Acid, Venous 1.4 0.5 - 1.9 mmol/L  Influenza panel by PCR (type A & B)  Result Value Ref Range   Influenza A By PCR NEGATIVE NEGATIVE   Influenza B By PCR NEGATIVE NEGATIVE   Dg Chest 2 View Result Date: 10/01/2016 CLINICAL DATA:  83 y/o  F; cough with crackles and fever. EXAM: CHEST  2 VIEW COMPARISON:  11/29/2014 chest radiograph. FINDINGS: Chronic bronchitic changes and biapical pleuroparenchymal scarring is stable. Stable lung  hyperinflation. No focal consolidation, pleural effusion, or pneumothorax. Stable mild lower thoracic anterior compression deformity. Bones are demineralized. Aortic atherosclerosis with calcification. IMPRESSION: COPD and chronic bronchitic changes. No focal consolidation. Aortic atherosclerosis. Electronically Signed   By: Mitzi Hansen M.D.   On: 10/01/2016 19:16   Dg Lumbar Spine Complete Result Date: 10/01/2016 CLINICAL DATA:  Larey Seat today, landing on hip. EXAM: LUMBAR SPINE - COMPLETE 4+ VIEW COMPARISON:  07/28/2006 FINDINGS: Bi concave deformities at L3 and L4 are new from 2007 but more likely chronic. No acute fracture is evident. No bone lesion or bony destruction. IMPRESSION: Negative for acute lumbar spine fracture. Electronically Signed   By: Ellery Plunk M.D.   On: 10/01/2016 23:08   Dg Hips Bilat W Or Wo Pelvis 3-4 Views Result Date: 10/01/2016 CLINICAL DATA:  Larey Seat onto her hip today.  Denies pain. EXAM: DG HIP (WITH OR WITHOUT PELVIS) 3-4V BILAT COMPARISON:  None. FINDINGS: There is no evidence of hip fracture or dislocation. There is no evidence of arthropathy or other focal bone abnormality. IMPRESSION: Negative. Electronically Signed   By: Ellery Plunk M.D.   On: 10/01/2016 23:06    2355:  When pt stood for orthostatic VS: HR became irregular and increased to 140's. +UTI, UC pending; will dose IV rocephin. APAP given for fever with improvement. Dx and testing d/w pt and family.  Questions answered.  Verb understanding, agreeable to admit. T/C to [facility] Dr. Arlean Hopping, case discussed, including:  HPI, pertinent PM/SHx, VS/PE, dx testing, ED course and treatment:  Agreeable to admit.   Final Clinical Impressions(s) / ED Diagnoses   Final diagnoses:  Fever, unspecified fever cause  Urinary tract infection without hematuria, site unspecified  Tachycardia  Fall at home, initial encounter  Dementia without behavioral disturbance, unspecified dementia type    New  Prescriptions New Prescriptions   No medications on file     Samuel Jester, DO 10/04/16 1308

## 2016-10-02 NOTE — Evaluation (Signed)
Physical Therapy Evaluation Patient Details Name: Gloria Mathis MRN: [id number] DOB: 09/25/1922 Today's Date: 10/02/2016   History of Present Illness  83 y.o. female with history of HL, dementia who presents to ED today after a fall onto her hip earlier today.  Husband gives the history.  They both have been "sick" and not eating and they are "dehydrated".  He has had a cold and she had a fever.  No n/v no abd pain for her.  Denies dysuria, but also appears not to be a good historian and doesn't respond to most questions.  Xrays in ED were negative for fracture (LS spine and bilat hips).  CXR clear and EKG/ trop normal.  Dx: UTI.     Clinical Impression  Pt received in bed, son and dtr both present, and pt is agreeable to PT evaluation.  Pt has dementia, and is unable to answer PLOF questions, therefore dtr answers them.  Pt lives with her husband, and is normally independent with unlimited community ambulation.  She is normally independent with dressing, but requires assistance for bathing.  They have a home health aide that comes 3 days a week for 4 hrs at a time.  During PT evaluation, she demonstrated supine<>sit and sit<>stand at modified independent level with RW.  She was able to ambulate 25400ft with RW and min guard.  She did complain of pain in her L groin area.  She requires re-direction to task and repetition of commands due to baseline dementia.  At this point, she is recommended to d/c home with HHPT and continued 24/7 supervision/assistance.      Follow Up Recommendations Home health PT;Supervision/Assistance - 24 hour    Equipment Recommendations  Rolling walker with 5" wheels    Recommendations for Other Services       Precautions / Restrictions Precautions Precautions: Fall Precaution Comments: 2 falls Restrictions Weight Bearing Restrictions: No      Mobility  Bed Mobility Overal bed mobility: Modified Independent                Transfers Overall transfer  level: Modified independent Equipment used: Rolling walker (2 wheeled)                Ambulation/Gait Ambulation/Gait assistance: Min guard Ambulation Distance (Feet): 200 Feet Assistive device: Rolling walker (2 wheeled) Gait Pattern/deviations: Step-through pattern;Trunk flexed   Gait velocity interpretation: <1.8 ft/sec, indicative of risk for recurrent falls General Gait Details: Pt requires cues for which direction to go, and she is highly distracted and likes to look into all the rooms as she passes by.  Stairs            Wheelchair Mobility    Modified Rankin (Stroke Patients Only)       Balance Overall balance assessment: Needs assistance Sitting-balance support: Bilateral upper extremity supported;Feet supported Sitting balance-Leahy Scale: Good     Standing balance support: Bilateral upper extremity supported Standing balance-Leahy Scale: Fair                               Pertinent Vitals/Pain Pain Assessment: 0-10 (Pt initially denies pain until ambulating. ) Pain Location: Pt expressed pain in her L "liter" and points to her groin area Pain Descriptors / Indicators: Sharp Pain Intervention(s): Limited activity within patient's tolerance;Monitored during session;Repositioned    Home Living   Living Arrangements: Spouse/significant other Available Help at Discharge: Home health (aide that comes M,W,F - 4  hours per day. 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   ADL's / Homemaking Assistance Needed: Assistance with bathing  Comments: husband assists with grocery shopping     Hand Dominance        Extremity/Trunk Assessment   Upper Extremity Assessment Upper  Extremity Assessment: Overall WFL for tasks assessed    Lower Extremity Assessment Lower Extremity Assessment: Generalized weakness       Communication      Cognition Arousal/Alertness: Awake/alert Behavior During Therapy: WFL for tasks assessed/performed Overall Cognitive Status: History of cognitive impairments - at baseline Area of Impairment: Orientation (Pt has dementia, and family reports that her cognitive status is at baseline. ) Orientation Level: Disoriented to;Place;Time;Situation                  General Comments      Exercises     Assessment/Plan    PT Assessment Patient needs continued PT services  PT Problem List Decreased strength;Decreased activity tolerance;Decreased balance;Decreased mobility;Pain       PT Treatment Interventions DME instruction;Gait training;Functional mobility training;Therapeutic activities;Therapeutic exercise;Balance training;Patient/family education    PT Goals (Current goals can be found in the Care Plan section)  Acute Rehab PT Goals Patient Stated Goal: To go home PT Goal Formulation: With patient Time For Goal Achievement: 10/09/16 Potential to Achieve Goals: Good    Frequency Min 3X/week   Barriers to discharge        Co-evaluation               End of Session Equipment Utilized During Treatment: Gait belt Activity Tolerance: Patient tolerated treatment well Patient left: in bed;with bed alarm set;with family/visitor present Nurse Communication: Mobility status (Christina, RN notified, and mobility sheet left hanging in the room. ) PT Visit Diagnosis: Other abnormalities of gait and mobility (R26.89);Muscle weakness (generalized) (M62.81);History of falling (Z91.81)    Functional Assessment Tool Used: AM-PAC 6 Clicks Basic Mobility;Clinical judgement Functional Limitation: Mobility: Walking and moving around Mobility: Walking and Moving Around Current Status (W0981): At least 20 percent but less than  40 percent impaired, limited or restricted Mobility: Walking and Moving Around Goal Status [phone]): At least 1 percent but less than 20 percent impaired, limited or restricted    Time: 8295-6213 PT Time Calculation (min) (ACUTE ONLY): 29 min   Charges:   PT Evaluation $PT Eval Low Complexity: 1 Procedure PT Treatments $Gait Training: 8-22 mins   PT G Codes:   PT G-Codes **NOT FOR INPATIENT CLASS** Functional Assessment Tool Used: AM-PAC 6 Clicks Basic Mobility;Clinical judgement Functional Limitation: Mobility: Walking and moving around Mobility: Walking and Moving Around Current Status ([id number]): At least 20 percent but less than 40 percent impaired, limited or restricted Mobility: Walking and Moving Around Goal Status [phone]): At least 1 percent but less than 20 percent impaired, limited or restricted     Beth Gershom Brobeck, PT, DPT X: [phone]

## 2016-10-02 NOTE — Progress Notes (Signed)
Patient with advanced dementia had backward fall without syncope but her lumbosacral spine no fracture of hip lower LS-spine noted by x-rays patient found to have significant UTI currently placed on Rocephin she has been quite weak preceding weeks will have physical therapy for strengthening and ambulation to monitor safety Gloria Mathis [id number] DOB: 08/25/1922 DOA: 10/01/2016 PCP: Maricela Curet, MD   Physical Exam: Blood pressure 124/86, pulse 82, temperature 97.9 F (36.6 C), temperature source Oral, resp. rate 18, height 5' 1" (1.549 m), weight 37.1 kg (81 lb 12.7 oz), SpO2 97 %. Lungs clear to A&P no rales wheeze rhonchi heart regular rhythm no S3 or S4 no heaves thrills rubs abdomen soft nontender bowel sounds normoactive no guarding or rebound masses no megaly   Investigations:  No results found for this or any previous visit (from the past 240 hour(s)).   Basic Metabolic Panel:  Recent Labs  10/01/16 1921  NA 135  K 3.5  CL 99*  CO2 27  GLUCOSE 106*  BUN 19  CREATININE 0.85  CALCIUM 8.8*   Liver Function Tests: No results for input(s): AST, ALT, ALKPHOS, BILITOT, PROT, ALBUMIN in the last 72 hours.   CBC:  Recent Labs  10/01/16 1921  WBC 7.5  NEUTROABS 6.1  HGB 13.4  HCT 40.7  MCV 91.3  PLT 217    Dg Chest 2 View  Result Date: 10/01/2016 CLINICAL DATA:  83 y/o  F; cough with crackles and fever. EXAM: CHEST  2 VIEW COMPARISON:  11/29/2014 chest radiograph. FINDINGS: Chronic bronchitic changes and biapical pleuroparenchymal scarring is stable. Stable lung hyperinflation. No focal consolidation, pleural effusion, or pneumothorax. Stable mild lower thoracic anterior compression deformity. Bones are demineralized. Aortic atherosclerosis with calcification. IMPRESSION: COPD and chronic bronchitic changes. No focal consolidation. Aortic atherosclerosis. Electronically Signed   By: Kristine Garbe M.D.   On: 10/01/2016 19:16   Dg Lumbar Spine  Complete  Result Date: 10/01/2016 CLINICAL DATA:  [place] today, landing on hip. EXAM: LUMBAR SPINE - COMPLETE 4+ VIEW COMPARISON:  07/28/2006 FINDINGS: Bi concave deformities at L3 and L4 are new from 2007 but more likely chronic. No acute fracture is evident. No bone lesion or bony destruction. IMPRESSION: Negative for acute lumbar spine fracture. Electronically Signed   By: Andreas Newport M.D.   On: 10/01/2016 23:08   Dg Hips Bilat W Or Wo Pelvis 3-4 Views  Result Date: 10/01/2016 CLINICAL DATA:  [place] onto her hip today.  Denies pain. EXAM: DG HIP (WITH OR WITHOUT PELVIS) 3-4V BILAT COMPARISON:  None. FINDINGS: There is no evidence of hip fracture or dislocation. There is no evidence of arthropathy or other focal bone abnormality. IMPRESSION: Negative. Electronically Signed   By: Andreas Newport M.D.   On: 10/01/2016 23:06      Medications:   Impression:  Principal Problem:   UTI (urinary tract infection) Active Problems:   Dementia   Fall   Volume depletion     Plan: Physical therapy consult for strengthening ambulation. Continue Rocephin IV 1 g every 24 hours monitor be met in a.m.  Consultants: Physical therapy   Procedures   Antibiotics: Rocephin IV       Time spent: 30 minutes   LOS: 0 days   Heydy Montilla M   10/02/2016, 12:40 PM

## 2016-10-02 NOTE — ED Notes (Signed)
Patient informed that should would be admitted upstairs to a room next to her husband.  Patient states that will be alright.

## 2016-10-03 LAB — BASIC METABOLIC PANEL
ANION GAP: 4 — AB (ref 5–15)
BUN: 10 mg/dL (ref 6–20)
CHLORIDE: 108 mmol/L (ref 101–111)
CO2: 28 mmol/L (ref 22–32)
CREATININE: 0.64 mg/dL (ref 0.44–1.00)
Calcium: 7.8 mg/dL — ABNORMAL LOW (ref 8.9–10.3)
GFR calc non Af Amer: >60 mL/min (ref >60)
Glucose, Bld: 92 mg/dL (ref 65–99)
Potassium: 3.1 mmol/L — ABNORMAL LOW (ref 3.5–5.1)
SODIUM: 140 mmol/L (ref 135–145)

## 2016-10-03 LAB — GLUCOSE, CAPILLARY: GLUCOSE-CAPILLARY: 77 mg/dL (ref 65–99)

## 2016-10-03 MED ORDER — POTASSIUM CHLORIDE CRYS ER 20 MEQ PO TBCR
20.0000 meq | EXTENDED_RELEASE_TABLET | Freq: Every day | ORAL | Status: DC
  Administered 2016-10-04: 20 meq via ORAL
  Filled 2016-10-03: qty 1

## 2016-10-03 MED ORDER — GUAIFENESIN-DM 100-10 MG/5ML PO SYRP
5.0000 mL | ORAL_SOLUTION | ORAL | Status: DC | PRN
  Administered 2016-10-03: 5 mL via ORAL
  Filled 2016-10-03: qty 5

## 2016-10-03 MED ORDER — ENSURE ENLIVE PO LIQD
237.0000 mL | Freq: Two times a day (BID) | ORAL | Status: DC
  Administered 2016-10-04: 237 mL via ORAL

## 2016-10-03 MED ORDER — LORATADINE 10 MG PO TABS
10.0000 mg | ORAL_TABLET | Freq: Every day | ORAL | Status: DC
  Administered 2016-10-03 – 2016-10-04 (×2): 10 mg via ORAL
  Filled 2016-10-03 (×2): qty 1

## 2016-10-03 NOTE — Progress Notes (Signed)
Patient ambulating unsteadily with the skull therapy potassium 3.1 we'll give K Dur 20 mEq by mouth now and daily check be met in a.m. Gloria Mathis [id number] DOB: 08/27/1922 DOA: 10/01/2016 PCP: [name], MD   Physical Exam: Blood pressure 138/68, pulse 66, temperature 97.7 F (36.5 C), temperature source Oral, resp. rate 18, height 5\' 1"  (1.549 m), weight 37.1 kg (81 lb 12.7 oz), SpO2 95 %. Lungs clear to A&P no rales wheeze rhonchi heart rhythm no S3 or S4 no heaves thrills rubs abdomen soft nontender bowel sounds normoactive   Investigations:  Recent Results (from the past 240 hour(s))  Urine culture     Status: Abnormal (Preliminary result)   Collection Time: 10/01/16  9:30 PM  Result Value Ref Range Status   Specimen Description URINE, CLEAN CATCH  Final   Special Requests NONE  Final   Culture >=100,000 COLONIES/mL ESCHERICHIA COLI (A)  Final   Report Status PENDING  Incomplete     Basic Metabolic Panel:  Recent Labs  12/01/16 1921 10/03/16 0454  NA 135 140  K 3.5 3.1*  CL 99* 108  CO2 27 28  GLUCOSE 106* 92  BUN 19 10  CREATININE 0.85 0.64  CALCIUM 8.8* 7.8*   Liver Function Tests: No results for input(s): AST, ALT, ALKPHOS, BILITOT, PROT, ALBUMIN in the last 72 hours.   CBC:  Recent Labs  10/01/16 1921  WBC 7.5  NEUTROABS 6.1  HGB 13.4  HCT 40.7  MCV 91.3  PLT 217    Dg Chest 2 View  Result Date: 10/01/2016 CLINICAL DATA:  83 y/o  F; cough with crackles and fever. EXAM: CHEST  2 VIEW COMPARISON:  11/29/2014 chest radiograph. FINDINGS: Chronic bronchitic changes and biapical pleuroparenchymal scarring is stable. Stable lung hyperinflation. No focal consolidation, pleural effusion, or pneumothorax. Stable mild lower thoracic anterior compression deformity. Bones are demineralized. Aortic atherosclerosis with calcification. IMPRESSION: COPD and chronic bronchitic changes. No focal consolidation. Aortic atherosclerosis. Electronically Signed    By: 01/29/2015 M.D.   On: 10/01/2016 19:16   Dg Lumbar Spine Complete  Result Date: 10/01/2016 CLINICAL DATA:  12/01/2016 today, landing on hip. EXAM: LUMBAR SPINE - COMPLETE 4+ VIEW COMPARISON:  07/28/2006 FINDINGS: Bi concave deformities at L3 and L4 are new from 2007 but more likely chronic. No acute fracture is evident. No bone lesion or bony destruction. IMPRESSION: Negative for acute lumbar spine fracture. Electronically Signed   By: 2008 M.D.   On: 10/01/2016 23:08   Dg Hips Bilat W Or Wo Pelvis 3-4 Views  Result Date: 10/01/2016 CLINICAL DATA:  12/01/2016 onto her hip today.  Denies pain. EXAM: DG HIP (WITH OR WITHOUT PELVIS) 3-4V BILAT COMPARISON:  None. FINDINGS: There is no evidence of hip fracture or dislocation. There is no evidence of arthropathy or other focal bone abnormality. IMPRESSION: Negative. Electronically Signed   By: Larey Seat M.D.   On: 10/01/2016 23:06      Medications:   Impression:  Principal Problem:   UTI (urinary tract infection) Active Problems:   Dementia   Fall   Volume depletion     Plan: KCl 20 mEq by mouth today and tomorrow check be met in a.m. continue physical therapy and Rocephin IV  Consultants: Physical therapy   Procedures   Antibiotics: Rocephin 1 g IV every 24 hours          Time spent: 30 minutes   LOS: 1 day   Gloria Mathis M   10/03/2016, 12:33  PM

## 2016-10-03 NOTE — Clinical Social Work Note (Signed)
CSW received consult for home health. CM notified. CSW signing off.  Derenda FennelKara Estefan Pattison, LCSW [phone]

## 2016-10-03 NOTE — Plan of Care (Signed)
Problem: Pain Managment: Goal: General experience of comfort will improve Outcome: Not Progressing Pt complains of pain in her right side. Tylenol given for pain management.  Problem: Nutrition: Goal: Adequate nutrition will be maintained Outcome: Not Progressing Pt still only eating 10%-30% of meals.

## 2016-10-03 NOTE — Progress Notes (Signed)
Physical Therapy Treatment Patient Details Name: Gloria Mathis MRN: [id number] DOB: 13-Dec-1922 Today's Date: 10/03/2016    History of Present Illness 83 y.o. female with history of HL, dementia who presents to ED today after a fall onto her hip earlier today.  Husband gives the history.  They both have been "sick" and not eating and they are "dehydrated".  He has had a cold and she had a fever.  No n/v no abd pain for her.  Denies dysuria, but also appears not to be a good historian and doesn't respond to most questions.  Xrays in ED were negative for fracture (LS spine and bilat hips).  CXR clear and EKG/ trop normal.  Dx: UTI.     PT Comments    Pt received in the room, she is pleasantly demented, and agreeable to PT tx.  She was only able to ambulate 172ft today, and was limited by pain, however she was not using the RW.  Instead, she used the wall rail.  Will continue gait training with RW at next session.  Recommend HHPT, and RW at home.     Follow Up Recommendations  Home health PT;Supervision/Assistance - 24 hour     Equipment Recommendations  Rolling walker with 5" wheels    Recommendations for Other Services       Precautions / Restrictions Precautions Precautions: Fall Precaution Comments: 2 falls Restrictions Weight Bearing Restrictions: No    Mobility  Bed Mobility Overal bed mobility: Modified Independent                Transfers Overall transfer level: Modified independent Equipment used: 1 person hand held assist                Ambulation/Gait Ambulation/Gait assistance: Min guard Ambulation Distance (Feet): 100 Feet Assistive device: 1 person hand held assist (Wall rail) Gait Pattern/deviations: Step-through pattern     General Gait Details: further distance limited to increased pain today.    Stairs            Wheelchair Mobility    Modified Rankin (Stroke Patients Only)       Balance Overall balance assessment: Needs  assistance Sitting-balance support: Bilateral upper extremity supported;Feet supported Sitting balance-Leahy Scale: Good     Standing balance support: Bilateral upper extremity supported Standing balance-Leahy Scale: Fair                      Cognition Arousal/Alertness: Awake/alert Behavior During Therapy: WFL for tasks assessed/performed Overall Cognitive Status: History of cognitive impairments - at baseline                      Exercises      General Comments        Pertinent Vitals/Pain Pain Assessment: Faces Faces Pain Scale: Hurts even more Pain Location: Pt expressed pain in her L "liter" and points to her groin area Pain Descriptors / Indicators: Grimacing Pain Intervention(s): Limited activity within patient's tolerance;Monitored during session;Repositioned    Home Living                      Prior Function            PT Goals (current goals can now be found in the care plan section) Acute Rehab PT Goals Patient Stated Goal: To go home PT Goal Formulation: With patient Time For Goal Achievement: 10/09/16 Potential to Achieve Goals: Good Progress towards PT goals: Progressing toward  goals    Frequency    Min 3X/week      PT Plan      Co-evaluation             End of Session Equipment Utilized During Treatment: Gait belt Activity Tolerance: Patient tolerated treatment well Patient left: in bed;with bed alarm set;with nursing/sitter in room Nurse Communication: Mobility status PT Visit Diagnosis: Other abnormalities of gait and mobility (R26.89);Muscle weakness (generalized) (M62.81);History of falling (Z91.81)     Time: [id number] PT Time Calculation (min) (ACUTE ONLY): 16 min  Charges:  $Gait Training: 8-22 mins                    G Codes:       Beth Christella App, PT, DPT X: [phone]

## 2016-10-04 LAB — URINE CULTURE

## 2016-10-04 LAB — BASIC METABOLIC PANEL
ANION GAP: 5 (ref 5–15)
BUN: 5 mg/dL — ABNORMAL LOW (ref 6–20)
CHLORIDE: 107 mmol/L (ref 101–111)
CO2: 28 mmol/L (ref 22–32)
CREATININE: 0.5 mg/dL (ref 0.44–1.00)
Calcium: 7.7 mg/dL — ABNORMAL LOW (ref 8.9–10.3)
GFR calc non Af Amer: >60 mL/min (ref >60)
Glucose, Bld: 90 mg/dL (ref 65–99)
POTASSIUM: 2.8 mmol/L — AB (ref 3.5–5.1)
SODIUM: 140 mmol/L (ref 135–145)

## 2016-10-04 MED ORDER — CIPROFLOXACIN HCL 500 MG PO TABS: 500.0000 mg | ORAL_TABLET | Freq: Two times a day (BID) | ORAL | 0 refills | Status: AC

## 2016-10-04 MED ORDER — POTASSIUM CHLORIDE CRYS ER 20 MEQ PO TBCR: 20.0000 meq | EXTENDED_RELEASE_TABLET | Freq: Every day | ORAL | 1 refills | Status: AC

## 2016-10-04 NOTE — Care Management Note (Signed)
Case Management Note  Patient Details  Name: Gloria NuttingVirginia B Luallen MRN: [id number] Date of Birth: 04/10/1923  Subjective/Objective:                  Pt admitted with UTI. She is from home, lives with her husband. She has dementia. She ambulates ind pta. She has PCP, transportation to appointments and no difficulty affording medications. Pt has children who are very involved and husband is primary caregiver. Pt receives aid services three days a week through [facility]. PT has recommended HH PT and use of RW. Pt has RW pta. [facility]. Family aware HH has 48hrs to make first visit.   Action/Plan: Jacqulyn CaneEdwina Swann, of [facility], aware of referral and will obtain pt info from chart. Pt discharging home today. Family request PT prior to DC.   Expected Discharge Date:  10/04/16               Expected Discharge Plan:  Home w Home Health Services  In-House Referral:  NA  Discharge planning Services  CM Consult  Post Acute Care Choice:  Home Health Choice offered to:  Adult Children  Status of Service:  Completed, signed off  Malcolm MetroChildress, Nikkie Liming Demske, RN 10/04/2016, 9:33 AM

## 2016-10-04 NOTE — Progress Notes (Signed)
IV removed, WNL. D/C instructions given to pt and daughter. Verbalized understanding. Awaiting family member to take home.

## 2016-10-04 NOTE — Care Management Important Message (Signed)
Important Message  Patient Details  Name: Gloria Mathis MRN: [id number] Date of Birth: 07/14/1923   Medicare Important Message Given:  Yes    Malcolm MetroChildress, Liron Eissler Demske, RN 10/04/2016, 9:40 AM

## 2016-10-04 NOTE — Discharge Summary (Signed)
Physician Discharge Summary  Gloria NuttingVirginia B Mathis WUJ:[id number] DOB: 04/26/1923 DOA: 10/01/2016  PCP: Isabella StallingNDIEGO,Flavio Lindroth M, MD  Admit date: 10/01/2016 Discharge date: 10/04/2016   Recommendations for Outpatient Follow-up:  Patient is advised to take Cipro 500 mg 1 by mouth twice a day for 5 additional days at home. Likewise to take calcium chloride 20 mEq by mouth daily for 30 days and then reevaluate with lab work she should follow-up my office in 1-2 weeks' time to assess potassium and volume status and clearance of urinary tract infection Discharge Diagnoses:  Principal Problem:   UTI (urinary tract infection) Active Problems:   Dementia   Fall   Volume depletion   Discharge Condition: Murlean HarkGood  Filed Weights   10/01/16 1819 10/02/16 0236  Weight: 40.8 kg (90 lb) 37.1 kg (81 lb 12.7 oz)    History of present illness:  The patient is a 83 year old white female with advanced dementia which is chronic who had a near fall with prior to the hospital found to be volume depleted with a significant UTI she was admitted placed on IV Rocephin for a period of 72 hours she continued to improve had physical therapy for strengthening and ambulation. She continues to well was felt safe to return home with the addition of Cipro 500 mg by mouth twice a day for an additional 5 days while in hospital potassium was low and she was given case KCl 20 mEq by mouth daily for an additional 30 days she is to take all of the medicines at home  Hospital Course:  See history of present illness above  Procedures:    Consultations:    Discharge Instructions  Discharge Instructions    Discharge instructions    Complete by:  As directed    Discharge patient    Complete by:  As directed    Discharge disposition:  01-Home or Self Care   Discharge patient date:  10/04/2016     Allergies as of 10/04/2016   No Known Allergies     Medication List    TAKE these medications   aspirin EC 81 MG tablet Take 162  mg by mouth daily as needed for moderate pain or fever.   CALCIUM PO Take 1 tablet by mouth daily.   ciprofloxacin 500 MG tablet Commonly known as:  CIPRO Take 1 tablet (500 mg total) by mouth 2 (two) times daily.   multivitamin with minerals Tabs tablet Take 1 tablet by mouth daily.   potassium chloride SA 20 MEQ tablet Commonly known as:  K-DUR,KLOR-CON Take 1 tablet (20 mEq total) by mouth daily.   vitamin E 400 UNIT capsule Take 400 Units by mouth daily.      No Known Allergies    The results of significant diagnostics from this hospitalization (including imaging, microbiology, ancillary and laboratory) are listed below for reference.    Significant Diagnostic Studies: Dg Chest 2 View  Result Date: 10/01/2016 CLINICAL DATA:  83 y/o  F; cough with crackles and fever. EXAM: CHEST  2 VIEW COMPARISON:  11/29/2014 chest radiograph. FINDINGS: Chronic bronchitic changes and biapical pleuroparenchymal scarring is stable. Stable lung hyperinflation. No focal consolidation, pleural effusion, or pneumothorax. Stable mild lower thoracic anterior compression deformity. Bones are demineralized. Aortic atherosclerosis with calcification. IMPRESSION: COPD and chronic bronchitic changes. No focal consolidation. Aortic atherosclerosis. Electronically Signed   By: Mitzi HansenLance  Furusawa-Stratton M.D.   On: 10/01/2016 19:16   Dg Lumbar Spine Complete  Result Date: 10/01/2016 CLINICAL DATA:  Larey SeatFell today, landing  on hip. EXAM: LUMBAR SPINE - COMPLETE 4+ VIEW COMPARISON:  07/28/2006 FINDINGS: Bi concave deformities at L3 and L4 are new from 2007 but more likely chronic. No acute fracture is evident. No bone lesion or bony destruction. IMPRESSION: Negative for acute lumbar spine fracture. Electronically Signed   By: Ellery Plunk M.D.   On: 10/01/2016 23:08   Dg Hips Bilat W Or Wo Pelvis 3-4 Views  Result Date: 10/01/2016 CLINICAL DATA:  Larey Seat onto her hip today.  Denies pain. EXAM: DG HIP (WITH OR WITHOUT  PELVIS) 3-4V BILAT COMPARISON:  None. FINDINGS: There is no evidence of hip fracture or dislocation. There is no evidence of arthropathy or other focal bone abnormality. IMPRESSION: Negative. Electronically Signed   By: Ellery Plunk M.D.   On: 10/01/2016 23:06    Microbiology: Recent Results (from the past 240 hour(s))  Urine culture     Status: Abnormal (Preliminary result)   Collection Time: 10/01/16  9:30 PM  Result Value Ref Range Status   Specimen Description URINE, CLEAN CATCH  Final   Special Requests NONE  Final   Culture >=100,000 COLONIES/mL ESCHERICHIA COLI (A)  Final   Report Status PENDING  Incomplete     Labs: Basic Metabolic Panel:  Recent Labs Lab 10/01/16 1921 10/03/16 0454 10/04/16 0434  NA 135 140 140  K 3.5 3.1* 2.8*  CL 99* 108 107  CO2 27 28 28   GLUCOSE 106* 92 90  BUN 19 10 5*  CREATININE 0.85 0.64 0.50  CALCIUM 8.8* 7.8* 7.7*   Liver Function Tests: No results for input(s): AST, ALT, ALKPHOS, BILITOT, PROT, ALBUMIN in the last 168 hours. No results for input(s): LIPASE, AMYLASE in the last 168 hours. No results for input(s): AMMONIA in the last 168 hours. CBC:  Recent Labs Lab 10/01/16 1921  WBC 7.5  NEUTROABS 6.1  HGB 13.4  HCT 40.7  MCV 91.3  PLT 217   Cardiac Enzymes:  Recent Labs Lab 10/01/16 1921  TROPONINI <0.03   BNP: BNP (last 3 results) No results for input(s): BNP in the last 8760 hours.  ProBNP (last 3 results) No results for input(s): PROBNP in the last 8760 hours.  CBG:  Recent Labs Lab 10/02/16 1641 10/02/16 2152 10/03/16 0643  GLUCAP 73 81 77       Signed:  Asencion Guisinger M  [facility] Pager: [phone] 10/04/2016, 6:57 AM

## 2016-10-04 NOTE — Care Management (Signed)
    Durable Medical Equipment        Start     Ordered   10/04/16 0831  For home use only DME Walker rolling  Once    Question:  Patient needs a walker to treat with the following condition  Answer:  Osteopenia   10/04/16 0831

## 2016-10-08 DIAGNOSIS — F0390 Unspecified dementia without behavioral disturbance: Secondary | ICD-10-CM | POA: Diagnosis not present

## 2016-10-08 DIAGNOSIS — R531 Weakness: Secondary | ICD-10-CM | POA: Diagnosis not present

## 2016-10-08 DIAGNOSIS — Z9181 History of falling: Secondary | ICD-10-CM | POA: Diagnosis not present

## 2016-10-08 DIAGNOSIS — N390 Urinary tract infection, site not specified: Secondary | ICD-10-CM | POA: Diagnosis not present

## 2016-10-15 DIAGNOSIS — R531 Weakness: Secondary | ICD-10-CM | POA: Diagnosis not present

## 2016-10-15 DIAGNOSIS — F0390 Unspecified dementia without behavioral disturbance: Secondary | ICD-10-CM | POA: Diagnosis not present

## 2016-10-15 DIAGNOSIS — N390 Urinary tract infection, site not specified: Secondary | ICD-10-CM | POA: Diagnosis not present

## 2016-10-15 DIAGNOSIS — Z9181 History of falling: Secondary | ICD-10-CM | POA: Diagnosis not present

## 2017-03-03 ENCOUNTER — Emergency Department (HOSPITAL_COMMUNITY)
Admission: EM | Admit: 2017-03-03 | Discharge: 2017-03-03 | Disposition: A | Discharge status: Home / Self Care | Payer: PPO | Attending: Emergency Medicine | Admitting: Emergency Medicine

## 2017-03-03 ENCOUNTER — Emergency Department (HOSPITAL_COMMUNITY): Payer: PPO

## 2017-03-03 ENCOUNTER — Encounter (HOSPITAL_COMMUNITY): Payer: Self-pay | Admitting: Emergency Medicine

## 2017-03-03 DIAGNOSIS — Y998 Other external cause status: Secondary | ICD-10-CM | POA: Diagnosis not present

## 2017-03-03 DIAGNOSIS — R0789 Other chest pain: Secondary | ICD-10-CM | POA: Diagnosis present

## 2017-03-03 DIAGNOSIS — Y9389 Activity, other specified: Secondary | ICD-10-CM | POA: Diagnosis not present

## 2017-03-03 DIAGNOSIS — W06XXXA Fall from bed, initial encounter: Secondary | ICD-10-CM | POA: Diagnosis not present

## 2017-03-03 DIAGNOSIS — Z79899 Other long term (current) drug therapy: Secondary | ICD-10-CM | POA: Insufficient documentation

## 2017-03-03 DIAGNOSIS — R0781 Pleurodynia: Secondary | ICD-10-CM | POA: Diagnosis not present

## 2017-03-03 DIAGNOSIS — Y92009 Unspecified place in unspecified non-institutional (private) residence as the place of occurrence of the external cause: Secondary | ICD-10-CM | POA: Diagnosis not present

## 2017-03-03 DIAGNOSIS — S20212A Contusion of left front wall of thorax, initial encounter: Secondary | ICD-10-CM | POA: Insufficient documentation

## 2017-03-03 DIAGNOSIS — S299XXA Unspecified injury of thorax, initial encounter: Secondary | ICD-10-CM | POA: Diagnosis not present

## 2017-03-03 DIAGNOSIS — W19XXXA Unspecified fall, initial encounter: Secondary | ICD-10-CM | POA: Insufficient documentation

## 2017-03-03 NOTE — ED Provider Notes (Addendum)
[facility]-EMERGENCY DEPT Provider Note   CSN: [id number] Arrival date & time: 03/03/17  0944     History   Chief Complaint Chief Complaint  Patient presents with  . Fall    HPI [place] Gloria Mathis is a 83 y.o. female.  Pt presents to the ED with left rib pain after a fall 2 nights ago.  The pt misjudged her bed and fell while trying to get back into bed.  She initially did not have much pain, but it hurt to get up this morning.  Pt has dementia and does not remember the event.  She did not hit her head.  No loc.  Husband gives hx.      Past Medical History:  Diagnosis Date  . Dementia   . Hyperlipidemia   . Osteopenia     Patient Active Problem List   Diagnosis Date Noted  . UTI (urinary tract infection) 10/02/2016  . Fall 10/02/2016  . Volume depletion 10/02/2016  . Hypokalemia 11/30/2014  . Dementia     History reviewed. No pertinent surgical history.  OB History    No data available       Home Medications    Prior to Admission medications   Medication Sig Start Date End Date Taking? Authorizing Provider  aspirin EC 81 MG tablet Take 162 mg by mouth daily as needed for moderate pain or fever.    [provider]  CALCIUM PO Take 1 tablet by mouth daily.    [provider]  Multiple Vitamin (MULTIVITAMIN WITH MINERALS) TABS tablet Take 1 tablet by mouth daily.    [provider]  potassium chloride SA (K-DUR,KLOR-CON) 20 MEQ tablet Take 1 tablet (20 mEq total) by mouth daily. 10/04/16   Dondiego, Gerlene Burdock, MD  vitamin E 400 UNIT capsule Take 400 Units by mouth daily.    [provider]    Family History No family history on file.  Social History Social History  Substance Use Topics  . Smoking status: Never Smoker  . Smokeless tobacco: Never Used  . Alcohol use No     Allergies   Patient has no known allergies.   Review of Systems Review of Systems  Musculoskeletal:       Left rib pain  All other systems reviewed  and are negative.    Physical Exam Updated Vital Signs BP (!) 177/98 (BP Location: Right Arm)   Pulse 98   Temp 98.2 F (36.8 C) (Oral)   Ht 5\' 4"  (1.626 m)   Wt 43.1 kg (95 lb)   SpO2 96%   BMI 16.31 kg/m   Physical Exam  Constitutional: She appears well-developed and well-nourished.  HENT:  Head: Normocephalic and atraumatic.  Right Ear: External ear normal.  Left Ear: External ear normal.  Nose: Nose normal.  Mouth/Throat: Oropharynx is clear and moist.  Eyes: Pupils are equal, round, and reactive to light. Conjunctivae and EOM are normal.  Neck: Normal range of motion. Neck supple.  Cardiovascular: Normal rate, regular rhythm, normal heart sounds and intact distal pulses.   Pulmonary/Chest: Effort normal and breath sounds normal.  Abdominal: Soft. Bowel sounds are normal.  Musculoskeletal: Normal range of motion.       Arms: Neurological: She is alert.  Oriented to person and place  Skin: Skin is warm.  Psychiatric: She has a normal mood and affect. Her behavior is normal. Judgment and thought content normal.  Nursing note and vitals reviewed.    ED Treatments / Results  Labs (  all labs ordered are listed, but only abnormal results are displayed) Labs Reviewed - No data to display  EKG  EKG Interpretation None       Radiology Dg Ribs Unilateral W/chest Left  Result Date: 03/03/2017 CLINICAL DATA:  Anterior left rib pain since a fall yesterday. EXAM: LEFT RIBS AND CHEST - 3+ VIEW COMPARISON:  PA and lateral chest 10/01/2016. FINDINGS: Coarsening of the pulmonary interstitium appears unchanged. No consolidative process, pneumothorax or effusion. Heart size is mildly enlarged. Aortic atherosclerosis is noted. No fracture is identified. IMPRESSION: Negative for fracture or acute disease. Cardiomegaly. Atherosclerosis. Electronically Signed   By: Drusilla Kannerhomas  Dalessio M.D.   On: 03/03/2017 10:35    Procedures Procedures (including critical care time)  Medications  Ordered in ED Medications - No data to display   Initial Impression / Assessment and Plan / ED Course  I have reviewed the triage vital signs and the nursing notes.  Pertinent labs & imaging results that were available during my care of the patient were reviewed by me and considered in my medical decision making (see chart for details).    Pt will be given an incentive spirometer prior to d/c.  Her pain is well controlled on oral tylenol.  She knows to return if worse.  Final Clinical Impressions(s) / ED Diagnoses   Final diagnoses:  Fall in home, initial encounter  Rib contusion, left, initial encounter    New Prescriptions New Prescriptions   No medications on file     Jacalyn LefevreHaviland, Bartlomiej Jenkinson, MD 03/03/17 1050    Jacalyn LefevreHaviland, Sujay Grundman, MD 03/03/17 1108

## 2017-03-03 NOTE — ED Triage Notes (Signed)
Patient with her husband. Husband states patient got up this morning to use the bathroom and fell when trying to get back into bed. He states she fell forward onto her left abdomin and rib cage. Patient has history of dementia.

## 2017-03-03 NOTE — Discharge Instructions (Signed)
Continue tylenol and ibuprofen as needed for pain. 

## 2017-10-25 IMAGING — DX DG HIP (WITH OR WITHOUT PELVIS) 3-4V BILAT
5 series · 5 of 5 positions shown · non-contrast
Comparison: None.

CLINICAL DATA: Fell onto her hip today.  Denies pain.

EXAM:
DG HIP (WITH OR WITHOUT PELVIS) 3-4V BILAT

[pelvis ap]
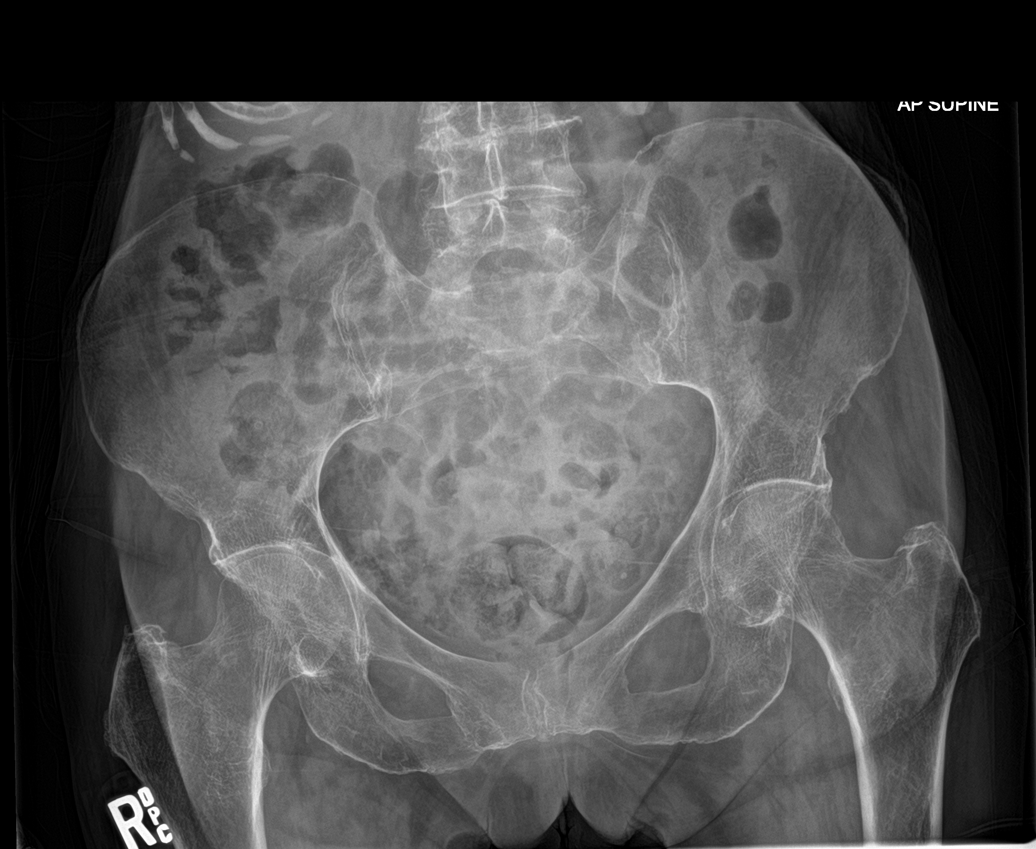

[hip ap (1 of 2)]
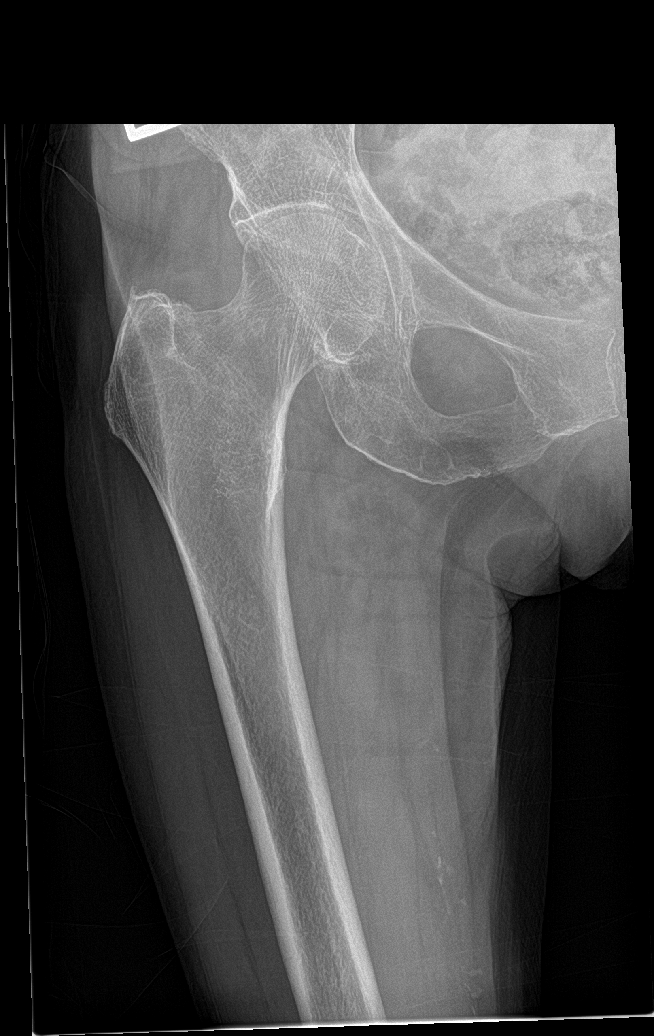

[hip lat (1 of 2)]
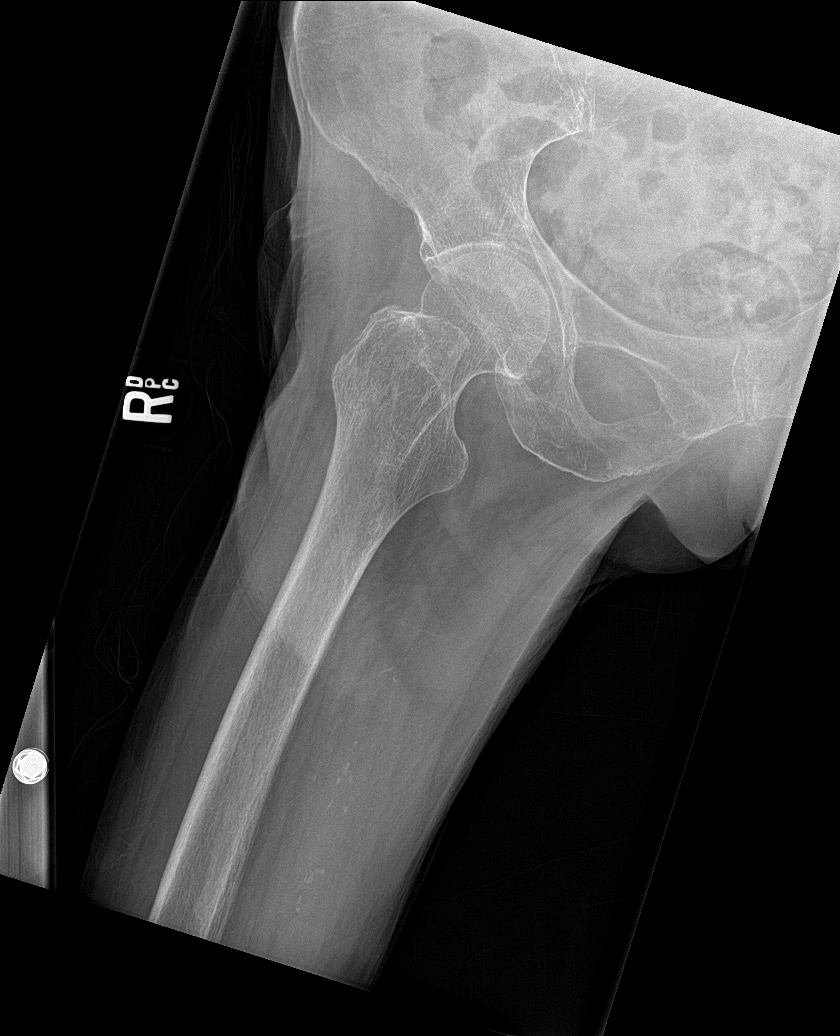

[hip ap (2 of 2)]
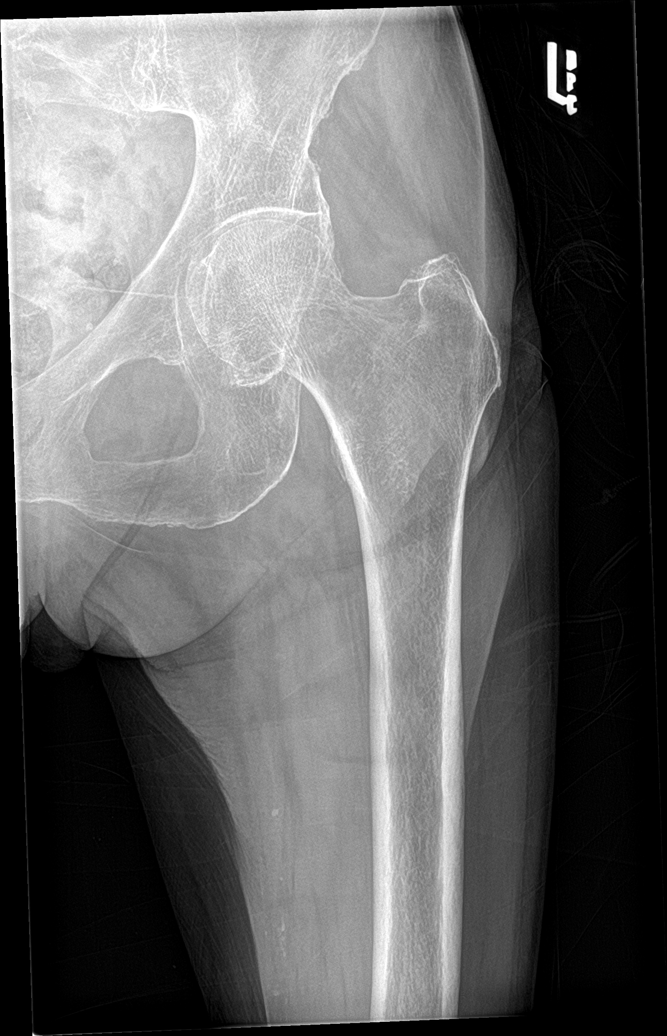

[hip lat (2 of 2)]
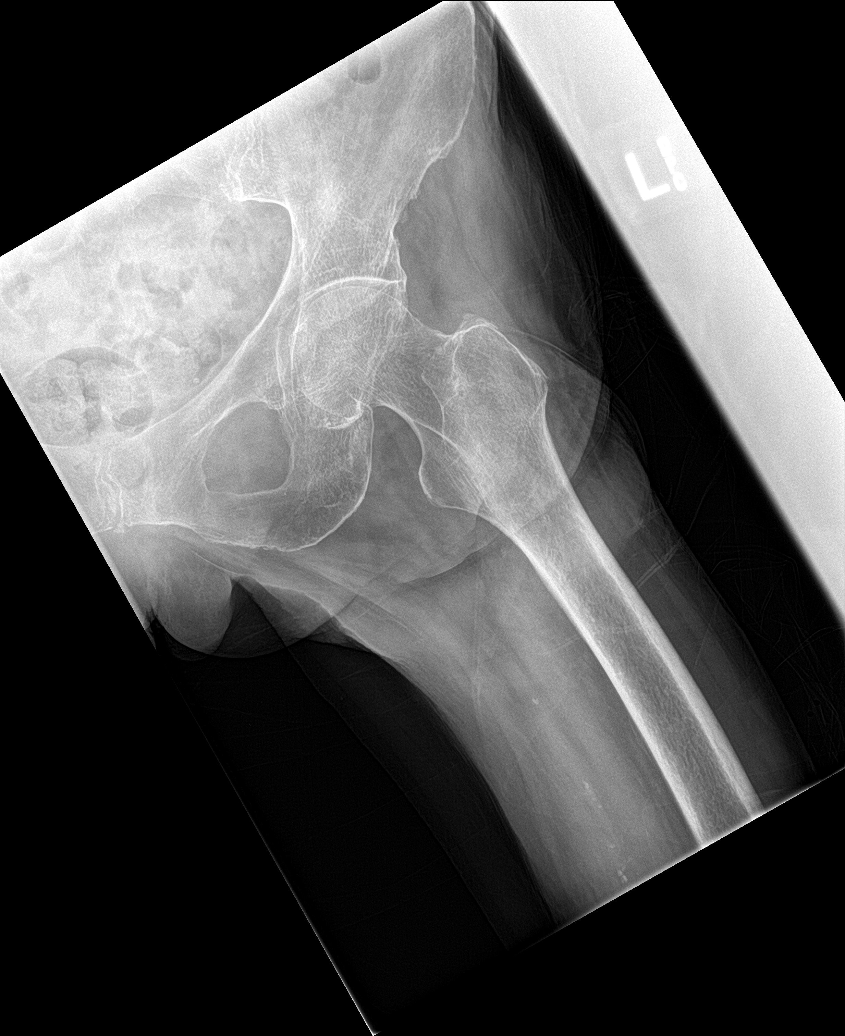

[5 of 5 positions shown; findings below may reference images not displayed]

FINDINGS: There is no evidence of hip fracture or dislocation. There is no
evidence of arthropathy or other focal bone abnormality.
IMPRESSION: Negative.

## 2017-10-25 IMAGING — DX DG LUMBAR SPINE COMPLETE 4+V
5 series · 5 of 5 positions shown · non-contrast
Comparison: 07/28/2006

CLINICAL DATA: Fell today, landing on hip.

EXAM:
LUMBAR SPINE - COMPLETE 4+ VIEW

[l-spine ap]
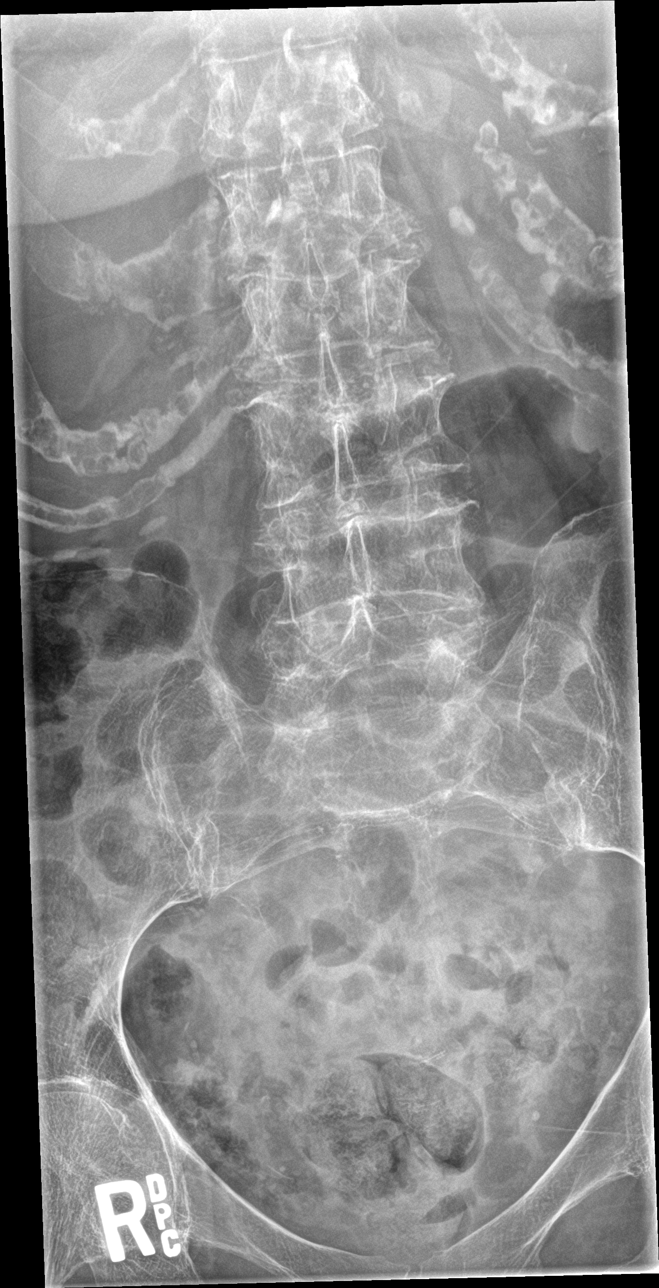

[l-spine obl (1 of 2)]
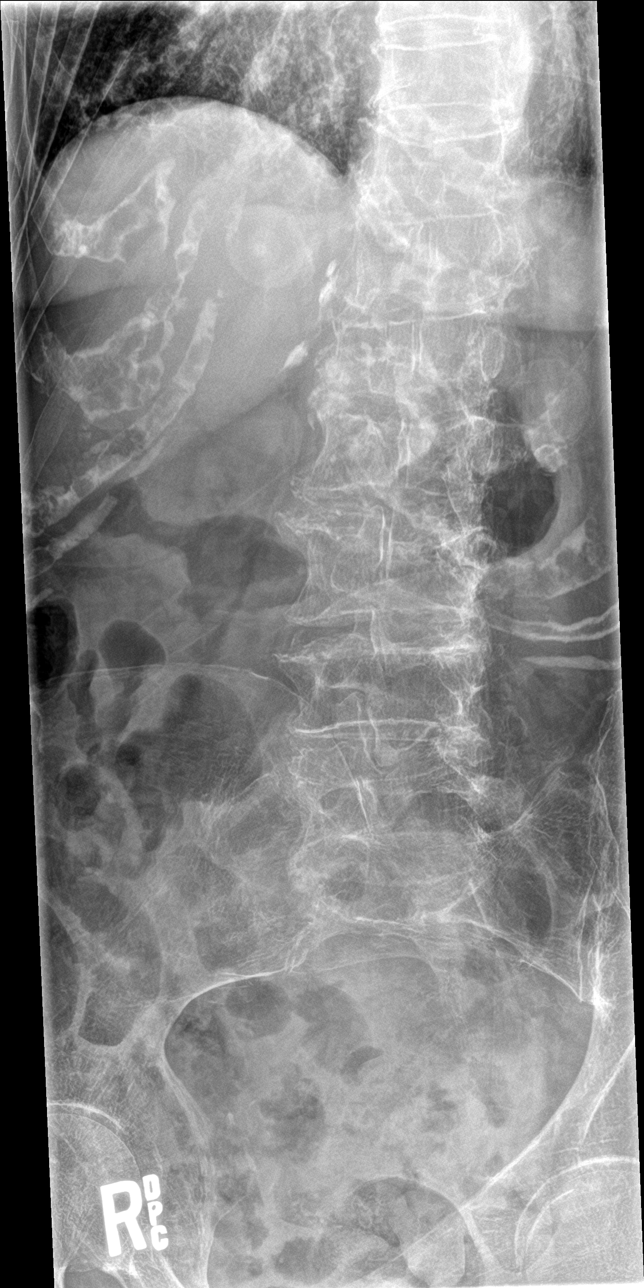

[l-spine obl (2 of 2)]
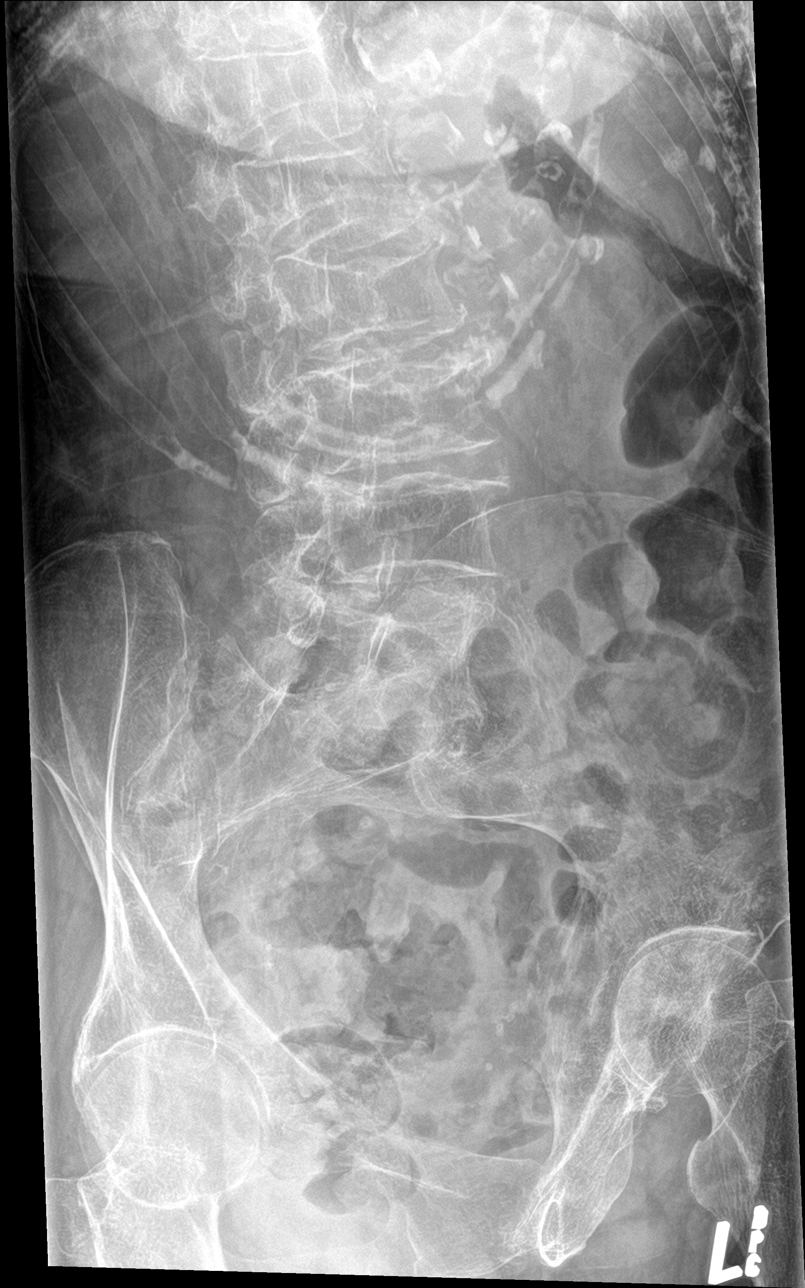

[l-spine lat]
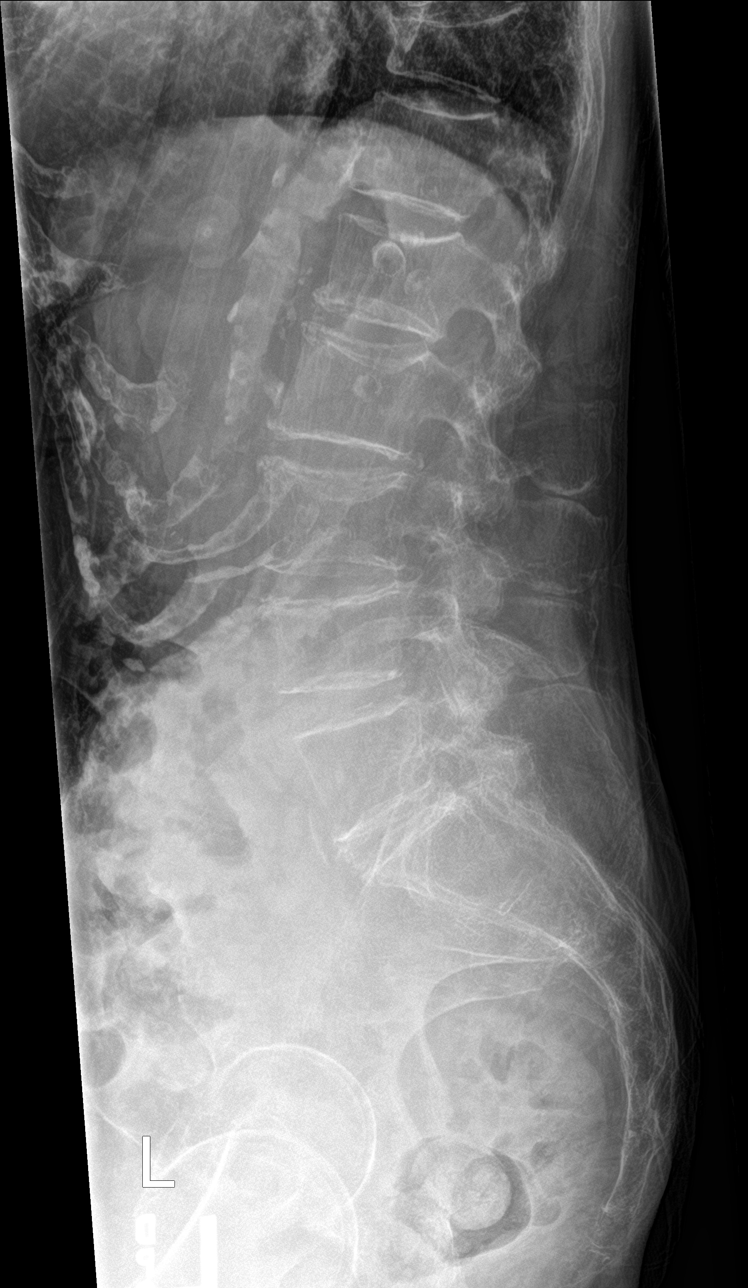

[l-spine spot]
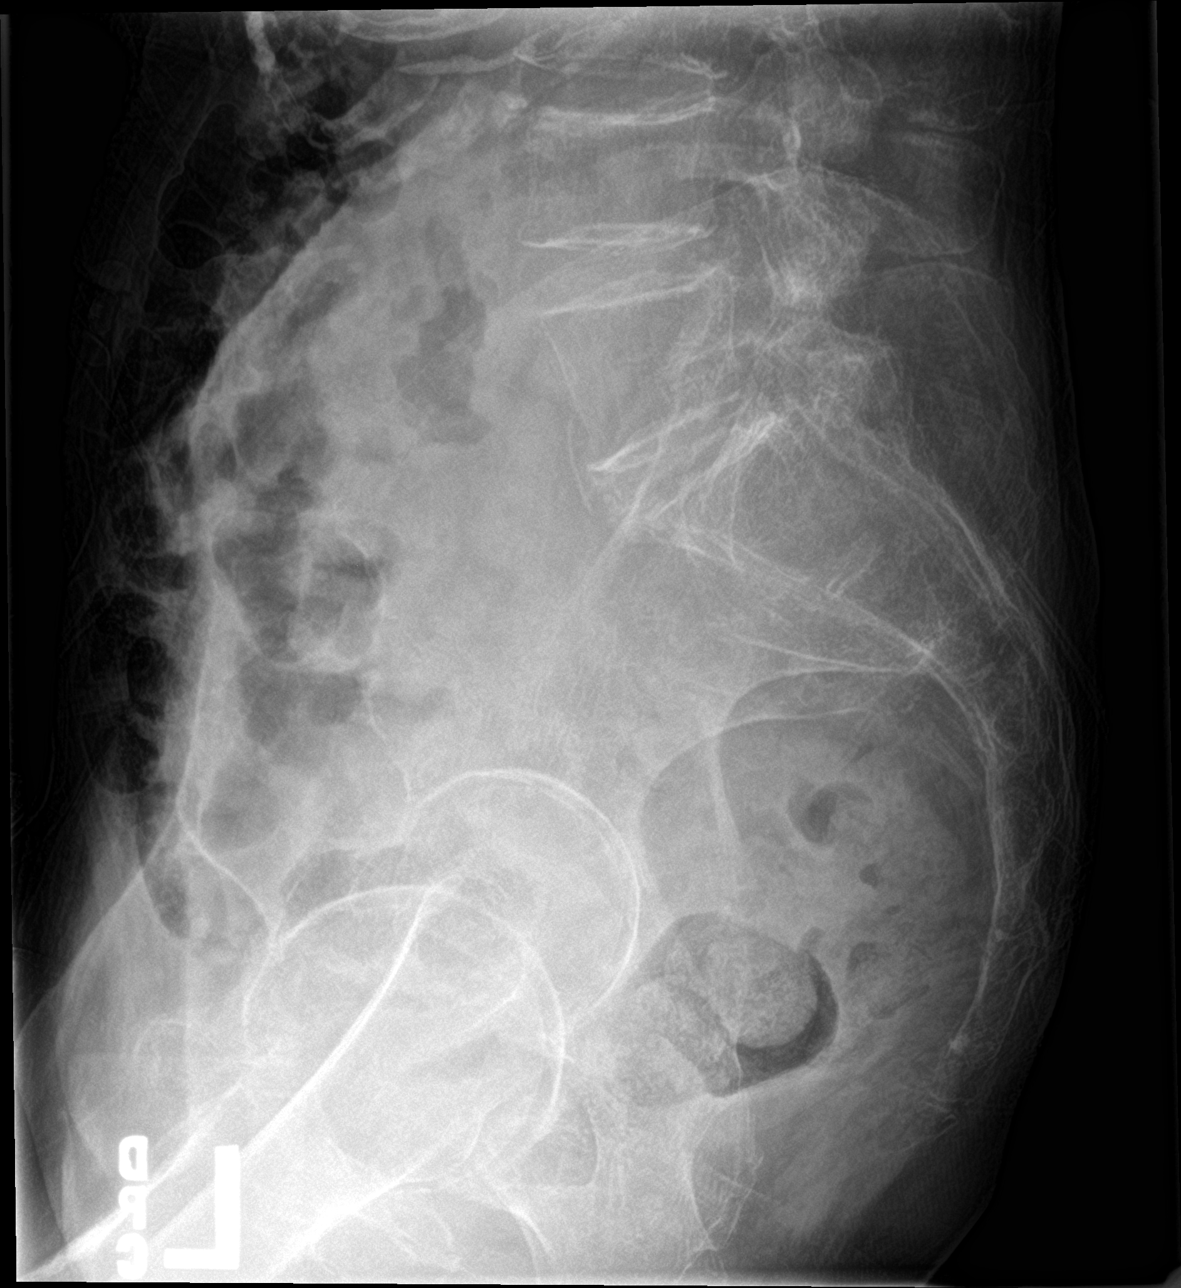

[5 of 5 positions shown; findings below may reference images not displayed]

FINDINGS: Bi concave deformities at L3 and L4 are new from 3889 but more
likely chronic. No acute fracture is evident. No bone lesion or bony
destruction.
IMPRESSION: Negative for acute lumbar spine fracture.

## 2018-02-26 DEATH — deceased
